# Patient Record
Sex: Female | Born: 1975
Health system: Southern US, Community
[De-identification: ages and names within clinical notes are randomized; demographics above are authoritative.]

## PROBLEM LIST (undated history)

## (undated) DIAGNOSIS — E0500 Thyrotoxicosis with diffuse goiter without thyrotoxic crisis or storm: Secondary | ICD-10-CM

## (undated) DIAGNOSIS — I8390 Asymptomatic varicose veins of unspecified lower extremity: Secondary | ICD-10-CM

## (undated) DIAGNOSIS — G43909 Migraine, unspecified, not intractable, without status migrainosus: Secondary | ICD-10-CM

## (undated) DIAGNOSIS — E039 Hypothyroidism, unspecified: Secondary | ICD-10-CM

## (undated) DIAGNOSIS — K219 Gastro-esophageal reflux disease without esophagitis: Secondary | ICD-10-CM

## (undated) HISTORY — DX: Thyrotoxicosis with diffuse goiter without thyrotoxic crisis or storm: E05.00

## (undated) HISTORY — PX: CERVICAL BIOPSY  W/ LOOP ELECTRODE EXCISION: SUR135

## (undated) HISTORY — DX: Gastro-esophageal reflux disease without esophagitis: K21.9

## (undated) HISTORY — DX: Asymptomatic varicose veins of unspecified lower extremity: I83.90

## (undated) HISTORY — DX: Hypothyroidism, unspecified: E03.9

## (undated) HISTORY — DX: Migraine, unspecified, not intractable, without status migrainosus: G43.909

---

## 2008-08-15 ENCOUNTER — Emergency Department (HOSPITAL_COMMUNITY): Admission: EM | Admit: 2008-08-15 | Discharge: 2008-08-15 | Payer: Self-pay | Admitting: Emergency Medicine

## 2014-05-07 ENCOUNTER — Ambulatory Visit
Admission: RE | Admit: 2014-05-07 | Discharge: 2014-05-07 | Disposition: A | Discharge status: Home / Self Care | Payer: BC Managed Care – PPO | Source: Ambulatory Visit | Attending: Internal Medicine | Admitting: Internal Medicine

## 2014-05-07 ENCOUNTER — Other Ambulatory Visit: Payer: Self-pay | Admitting: Internal Medicine

## 2014-05-07 DIAGNOSIS — M542 Cervicalgia: Secondary | ICD-10-CM

## 2014-05-07 DIAGNOSIS — M546 Pain in thoracic spine: Secondary | ICD-10-CM

## 2015-02-02 ENCOUNTER — Ambulatory Visit (HOSPITAL_COMMUNITY)
Admission: RE | Admit: 2015-02-02 | Discharge: 2015-02-02 | Disposition: A | Discharge status: Home / Self Care | Payer: BLUE CROSS/BLUE SHIELD | Source: Ambulatory Visit | Attending: Rheumatology | Admitting: Rheumatology

## 2015-02-02 ENCOUNTER — Other Ambulatory Visit (HOSPITAL_COMMUNITY): Payer: Self-pay | Admitting: Rheumatology

## 2015-02-02 DIAGNOSIS — M069 Rheumatoid arthritis, unspecified: Secondary | ICD-10-CM | POA: Insufficient documentation

## 2015-02-02 DIAGNOSIS — T451X5A Adverse effect of antineoplastic and immunosuppressive drugs, initial encounter: Secondary | ICD-10-CM

## 2016-08-16 DIAGNOSIS — K219 Gastro-esophageal reflux disease without esophagitis: Secondary | ICD-10-CM | POA: Diagnosis not present

## 2016-08-16 DIAGNOSIS — I8390 Asymptomatic varicose veins of unspecified lower extremity: Secondary | ICD-10-CM | POA: Diagnosis not present

## 2016-08-16 DIAGNOSIS — E039 Hypothyroidism, unspecified: Secondary | ICD-10-CM | POA: Diagnosis not present

## 2016-08-24 ENCOUNTER — Encounter: Payer: Self-pay | Admitting: Vascular Surgery

## 2016-09-01 ENCOUNTER — Encounter: Payer: Self-pay | Admitting: Vascular Surgery

## 2016-09-13 ENCOUNTER — Encounter: Payer: Self-pay | Admitting: Vascular Surgery

## 2016-09-13 ENCOUNTER — Ambulatory Visit (INDEPENDENT_AMBULATORY_CARE_PROVIDER_SITE_OTHER): Payer: 59 | Admitting: Vascular Surgery

## 2016-09-13 VITALS — BP 131/86 | HR 92 | Temp 98.4°F | Resp 16 | Ht 67.0 in | Wt 211.0 lb

## 2016-09-13 DIAGNOSIS — I83891 Varicose veins of right lower extremities with other complications: Secondary | ICD-10-CM | POA: Diagnosis not present

## 2016-09-13 NOTE — Progress Notes (Signed)
Subjective:     Patient ID: Kayla Aguilar, female   DOB: [birth year], 41 y.o.   MRN: [id number]  HPI This 41 year old female was referred by Dr.Varadarahan for evaluation of painful "knot" in the right leg area patient does develop swelling in both ankles as the day progresses. She sits during her job most of the day with her feet in a dependent position. She has no history of DVT thrombophlebitis stasis ulcers bleeding. The localized "knot" is in the pre-tibial region on the right and it causes itching and stinging usually at the end of the day. She has no symptoms in the left leg. She does not wear elastic compression stockings.  Past Medical History:  Diagnosis Date  . Esophageal reflux   . Graves disease   . Hypothyroid   . Migraine headache   . Varicose veins of lower extremity     Social History  Substance Use Topics  . Smoking status: Current Every Day Smoker    Packs/day: 0.50  . Smokeless tobacco: Never Used  . Alcohol use Yes     Comment: occaisionally a beer    No family history on file.  No Known Allergies   Current Outpatient Prescriptions:  .  levothyroxine (SYNTHROID, LEVOTHROID) 75 MCG tablet, Take by mouth., Disp: , Rfl:  .  metroNIDAZOLE (FLAGYL) 500 MG tablet, Take 4 tablets once by mouth, Disp: , Rfl:  .  norethindrone (CAMILA) 0.35 MG tablet, TAKE 1 TABLET BY MOUTH DAILY., Disp: , Rfl:  .  omeprazole (PRILOSEC) 20 MG capsule, Take 20 mg by mouth daily., Disp: , Rfl:   Vitals:   09/13/16 1002  BP: 131/86  Pulse: 92  Resp: 16  Temp: 98.4 F (36.9 C)  SpO2: 100%  Weight: 211 lb (95.7 kg)  Height: 5\' 7"  (1.702 m)    Body mass index is 33.05 kg/m.         Review of Systems Denies history of chest pain, dyspnea on exertion, PND, orthopnea, hemoptysis. Has history of Graves' disease.    Objective:   Physical Exam BP 131/86 (BP Location: Left Arm, Patient Position: Sitting, Cuff Size: Normal)   Pulse 92   Temp 98.4 F (36.9 C)   Resp  16   Ht 5\' 7"  (1.702 m)   Wt 211 lb (95.7 kg)   SpO2 100%   BMI 33.05 kg/m     Gen.-alert and oriented x3 in no apparent distress HEENT normal for age Lungs no rhonchi or wheezing Cardiovascular regular rhythm no murmurs carotid pulses 3+ palpable no bruits audible Abdomen soft nontender no palpable masses Musculoskeletal free of  major deformities Skin clear -no rashes Neurologic normal Lower extremities 3+ femoral and dorsalis pedis pulses palpable bilaterally with no edema Isolated prominent vein about 5 cm in length and 4 mm in diameter in the right pretibial region with slightly enlarged area laterally measuring up to 6 mm in diameter. No bulging varicosities noted in the leg. No hyperpigmentation or ulceration noted.  Today I performed a bedside SonoSite ultrasound exam which revealed normal great saphenous veins bilaterally with no evidence of reflux or enlargement       Assessment:     #1 isolated pretibial vein right leg with small varix and no evidence of reflux in great saphenous system bilaterally on ultrasound    Plan:     Discussed  the option of foam sclerotherapy versus no treatment and also explained to patient that there is nothing dangerous about this prominent vein She has  opted for no treatment if this enlarges or becomes more symptomatic she will be back in touch with [place] for further evaluation Return on when necessary basis

## 2017-04-12 DIAGNOSIS — Z124 Encounter for screening for malignant neoplasm of cervix: Secondary | ICD-10-CM | POA: Diagnosis not present

## 2017-04-12 DIAGNOSIS — R350 Frequency of micturition: Secondary | ICD-10-CM | POA: Diagnosis not present

## 2017-04-12 DIAGNOSIS — Z01419 Encounter for gynecological examination (general) (routine) without abnormal findings: Secondary | ICD-10-CM | POA: Diagnosis not present

## 2017-05-09 DIAGNOSIS — Z1231 Encounter for screening mammogram for malignant neoplasm of breast: Secondary | ICD-10-CM | POA: Diagnosis not present

## 2017-05-12 ENCOUNTER — Other Ambulatory Visit: Payer: Self-pay | Admitting: Obstetrics and Gynecology

## 2017-05-12 DIAGNOSIS — R928 Other abnormal and inconclusive findings on diagnostic imaging of breast: Secondary | ICD-10-CM

## 2017-05-24 ENCOUNTER — Other Ambulatory Visit: Payer: 59

## 2017-05-30 ENCOUNTER — Other Ambulatory Visit: Payer: Self-pay | Admitting: Obstetrics and Gynecology

## 2017-05-30 ENCOUNTER — Ambulatory Visit
Admission: RE | Admit: 2017-05-30 | Discharge: 2017-05-30 | Disposition: A | Discharge status: Home / Self Care | Payer: 59 | Source: Ambulatory Visit | Attending: Obstetrics and Gynecology | Admitting: Obstetrics and Gynecology

## 2017-05-30 DIAGNOSIS — R928 Other abnormal and inconclusive findings on diagnostic imaging of breast: Secondary | ICD-10-CM

## 2017-05-30 DIAGNOSIS — N6310 Unspecified lump in the right breast, unspecified quadrant: Secondary | ICD-10-CM

## 2017-05-30 DIAGNOSIS — N6321 Unspecified lump in the left breast, upper outer quadrant: Secondary | ICD-10-CM | POA: Diagnosis not present

## 2017-06-08 DIAGNOSIS — K219 Gastro-esophageal reflux disease without esophagitis: Secondary | ICD-10-CM | POA: Diagnosis not present

## 2017-06-08 DIAGNOSIS — Z23 Encounter for immunization: Secondary | ICD-10-CM | POA: Diagnosis not present

## 2017-06-08 DIAGNOSIS — E039 Hypothyroidism, unspecified: Secondary | ICD-10-CM | POA: Diagnosis not present

## 2017-06-08 DIAGNOSIS — Z0000 Encounter for general adult medical examination without abnormal findings: Secondary | ICD-10-CM | POA: Diagnosis not present

## 2017-08-15 DIAGNOSIS — M2550 Pain in unspecified joint: Secondary | ICD-10-CM | POA: Diagnosis not present

## 2017-08-15 DIAGNOSIS — M542 Cervicalgia: Secondary | ICD-10-CM | POA: Diagnosis not present

## 2017-09-13 DIAGNOSIS — M542 Cervicalgia: Secondary | ICD-10-CM | POA: Diagnosis not present

## 2017-09-13 DIAGNOSIS — M2550 Pain in unspecified joint: Secondary | ICD-10-CM | POA: Diagnosis not present

## 2017-10-23 ENCOUNTER — Ambulatory Visit: Payer: Self-pay | Admitting: Podiatry

## 2017-11-28 ENCOUNTER — Other Ambulatory Visit: Payer: 59

## 2017-12-12 ENCOUNTER — Ambulatory Visit
Admission: RE | Admit: 2017-12-12 | Discharge: 2017-12-12 | Disposition: A | Discharge status: Home / Self Care | Payer: 59 | Source: Ambulatory Visit | Attending: Obstetrics and Gynecology | Admitting: Obstetrics and Gynecology

## 2017-12-12 ENCOUNTER — Other Ambulatory Visit: Payer: Self-pay | Admitting: Obstetrics and Gynecology

## 2017-12-12 DIAGNOSIS — N63 Unspecified lump in unspecified breast: Secondary | ICD-10-CM

## 2017-12-12 DIAGNOSIS — R928 Other abnormal and inconclusive findings on diagnostic imaging of breast: Secondary | ICD-10-CM | POA: Diagnosis not present

## 2017-12-12 DIAGNOSIS — N6310 Unspecified lump in the right breast, unspecified quadrant: Secondary | ICD-10-CM

## 2018-01-01 ENCOUNTER — Ambulatory Visit: Payer: 59 | Admitting: Podiatry

## 2018-01-01 ENCOUNTER — Ambulatory Visit (INDEPENDENT_AMBULATORY_CARE_PROVIDER_SITE_OTHER): Payer: 59

## 2018-01-01 DIAGNOSIS — M7752 Other enthesopathy of left foot: Secondary | ICD-10-CM

## 2018-01-01 DIAGNOSIS — F17200 Nicotine dependence, unspecified, uncomplicated: Secondary | ICD-10-CM | POA: Insufficient documentation

## 2018-01-01 DIAGNOSIS — M779 Enthesopathy, unspecified: Secondary | ICD-10-CM

## 2018-01-01 DIAGNOSIS — L989 Disorder of the skin and subcutaneous tissue, unspecified: Secondary | ICD-10-CM

## 2018-01-01 DIAGNOSIS — M722 Plantar fascial fibromatosis: Secondary | ICD-10-CM

## 2018-01-01 DIAGNOSIS — M778 Other enthesopathies, not elsewhere classified: Secondary | ICD-10-CM

## 2018-01-01 MED ORDER — METHYLPREDNISOLONE 4 MG PO TBPK: ORAL_TABLET | ORAL | 0 refills | Status: DC

## 2018-01-01 MED ORDER — MELOXICAM 15 MG PO TABS: 15.0000 mg | ORAL_TABLET | Freq: Every day | ORAL | 1 refills | Status: DC

## 2018-01-01 NOTE — Patient Instructions (Signed)
For instructions on how to put on your Plantar Fascial Brace, please visit [email or web address]  

## 2018-01-03 NOTE — Progress Notes (Signed)
   Subjective: 42 year old female presenting today as a new patient with a chief complaint of a pain to the plantar aspect of the left heel that began several months ago. She reports an associate nodule to the left fifth toe. Walking increases the pain. She has not done anything for treatment. Patient is here for further evaluation and treatment.   Past Medical History:  Diagnosis Date  . Esophageal reflux   . Graves disease   . Hypothyroid   . Migraine headache   . Varicose veins of lower extremity      Objective: Physical Exam General: The patient is alert and oriented x3 in no acute distress.  Dermatology: Hyperkeratotic lesion present on the left foot x 2. Pain on palpation with a central nucleated core noted. Skin is warm, dry and supple bilateral lower extremities. Negative for open lesions or macerations bilateral.   Vascular: Dorsalis Pedis and Posterior Tibial pulses palpable bilateral.  Capillary fill time is immediate to all digits.  Neurological: Epicritic and protective threshold intact bilateral.   Musculoskeletal: Tenderness to palpation to the plantar aspect of the left heel along the plantar fascia. Pain with palpation to the fourth MPJ of the left foot. All other joints range of motion within normal limits bilateral. Strength 5/5 in all groups bilateral.   Radiographic exam:   Normal osseous mineralization. Joint spaces preserved. No fracture/dislocation/boney destruction. No other soft tissue abnormalities or radiopaque foreign bodies.   Assessment: 1. Plantar fasciitis left foot 2. 4th MPJ capsulitis left 3. Porokeratosis left x 2  Plan of Care:  1. Patient evaluated. Xrays reviewed.   2. Injection of 0.5cc Celestone soluspan injected into the left plantar fascia.  3. Injection of 0.5 mLs Celestone Soluspan injected into the 4th MPJ of the left foot.  4. Rx for Medrol Dose Pak placed 5. Rx for Meloxicam ordered for patient. 6. Plantar fascial band(s)  dispensed  7. Instructed patient regarding therapies and modalities at home to alleviate symptoms.  8. Excisional debridement of keratoic lesions using a chisel blade was performed without incident.  9. Return to clinic in 4 weeks.     Felecia Shelling, DPM [facility]  Dr. Felecia Shelling, DPM    [address] [place], [place] [address]                Office [phone]  Fax [phone]

## 2018-01-09 ENCOUNTER — Telehealth: Payer: Self-pay | Admitting: Podiatry

## 2018-01-09 MED ORDER — MELOXICAM 15 MG PO TABS: 15.0000 mg | ORAL_TABLET | Freq: Every day | ORAL | 1 refills | Status: AC

## 2018-01-09 NOTE — Telephone Encounter (Signed)
I saw Dr. Logan Bores on 12 August and he prescribed me meloxicam. I went out of town this weekend and left the medication there. It's going to be 2 weeks before I go back there and can get my medication. I wanted to see if there is a partial/temporary prescription I can get called into my pharmacy. If you could call me back and let me know what the best thing I can do is. You can reach me at work [phone] or my cell phone [phone]. Thank you.

## 2018-01-09 NOTE — Telephone Encounter (Addendum)
I informed pt Dr. Logan Bores had okayed refill as previously.

## 2018-01-09 NOTE — Addendum Note (Signed)
Addended by: Alphia Kava D on: 01/09/2018 10:05 AM   Modules accepted: Orders

## 2018-01-31 ENCOUNTER — Ambulatory Visit: Payer: 59 | Admitting: Podiatry

## 2018-02-28 ENCOUNTER — Ambulatory Visit: Payer: 59 | Admitting: Podiatry

## 2018-02-28 ENCOUNTER — Encounter

## 2018-02-28 DIAGNOSIS — M7752 Other enthesopathy of left foot: Secondary | ICD-10-CM

## 2018-02-28 DIAGNOSIS — M722 Plantar fascial fibromatosis: Secondary | ICD-10-CM | POA: Diagnosis not present

## 2018-03-02 ENCOUNTER — Ambulatory Visit: Payer: 59 | Admitting: Podiatry

## 2018-03-03 NOTE — Progress Notes (Signed)
   Subjective: 42 year old female presenting today for follow up evaluation of left foot pain. She states the pain resolved for about two months after receiving the injection at the previous visit. She now notes pain in the arch and under the fourth toe. She has been taking Meloxicam for pain. There are no modifying factors noted. Patient is here for further evaluation and treatment.   Past Medical History:  Diagnosis Date  . Esophageal reflux   . Graves disease   . Hypothyroid   . Migraine headache   . Varicose veins of lower extremity      Objective: Physical Exam General: The patient is alert and oriented x3 in no acute distress.  Dermatology: Skin is warm, dry and supple bilateral lower extremities. Negative for open lesions or macerations bilateral.   Vascular: Dorsalis Pedis and Posterior Tibial pulses palpable bilateral.  Capillary fill time is immediate to all digits.  Neurological: Epicritic and protective threshold intact bilateral.   Musculoskeletal: Tenderness to palpation to the plantar aspect of the left heel along the plantar fascia. Pain with palpation to the fourth MPJ of the left foot. All other joints range of motion within normal limits bilateral. Strength 5/5 in all groups bilateral.   Assessment: 1. Plantar fasciitis left foot 2. 4th MPJ capsulitis left  Plan of Care:  1. Patient evaluated.    2. Injection of 0.5cc Celestone soluspan injected into the left plantar fascia.  3. Injection of 0.5 mLs Celestone Soluspan injected into the 4th MPJ of the left foot.  4. Continue taking Meloxicam daily.  5. Continue using plantar fascial brace.  6. Return to clinic as needed.   Son getting married in [place] this weekend.    Felecia Shelling, DPM [facility]  Dr. Felecia Shelling, DPM    [address] [place], [place] [address]                Office [phone]  Fax [phone]

## 2018-04-04 ENCOUNTER — Ambulatory Visit: Payer: 59 | Admitting: Podiatry

## 2018-04-04 ENCOUNTER — Encounter: Payer: Self-pay | Admitting: Podiatry

## 2018-04-04 DIAGNOSIS — M722 Plantar fascial fibromatosis: Secondary | ICD-10-CM

## 2018-04-04 MED ORDER — METHYLPREDNISOLONE 4 MG PO TBPK: ORAL_TABLET | ORAL | 0 refills | Status: DC

## 2018-04-04 MED ORDER — MELOXICAM 15 MG PO TABS: 15.0000 mg | ORAL_TABLET | Freq: Every day | ORAL | 1 refills | Status: AC

## 2018-04-08 NOTE — Progress Notes (Signed)
   Subjective: 42 year old female presenting today for follow up evaluation of left foot pain. She states her symptoms have improved. She reports only having pain once she has been on the foot for long periods of time. She has been taking the Meloxicam as directed. Patient is here for further evaluation and treatment.   Past Medical History:  Diagnosis Date  . Esophageal reflux   . Graves disease   . Hypothyroid   . Migraine headache   . Varicose veins of lower extremity      Objective: Physical Exam General: The patient is alert and oriented x3 in no acute distress.  Dermatology: Skin is warm, dry and supple bilateral lower extremities. Negative for open lesions or macerations bilateral.   Vascular: Dorsalis Pedis and Posterior Tibial pulses palpable bilateral.  Capillary fill time is immediate to all digits.  Neurological: Epicritic and protective threshold intact bilateral.   Musculoskeletal: Tenderness to palpation to the plantar aspect of the left heel along the plantar fascia. All other joints range of motion within normal limits bilateral. Strength 5/5 in all groups bilateral.   Assessment: 1. Plantar fasciitis left foot 2. 4th MPJ capsulitis left - resolved   Plan of Care:  1. Patient evaluated.    2. Injection of 0.5cc Celestone soluspan injected into the left plantar fascia.  3. Prescription for Medrol Dose Pak provided to patient. Then continue taking Meloxicam daily.  4. Return to clinic as needed.    Felecia Shelling, DPM [facility]  Dr. Felecia Shelling, DPM    [address] [place], [place] [address]                Office [phone]  Fax [phone]

## 2018-04-30 DIAGNOSIS — R1310 Dysphagia, unspecified: Secondary | ICD-10-CM | POA: Diagnosis not present

## 2018-05-08 DIAGNOSIS — K228 Other specified diseases of esophagus: Secondary | ICD-10-CM | POA: Diagnosis not present

## 2018-05-08 DIAGNOSIS — R12 Heartburn: Secondary | ICD-10-CM | POA: Diagnosis not present

## 2018-05-08 DIAGNOSIS — R1310 Dysphagia, unspecified: Secondary | ICD-10-CM | POA: Diagnosis not present

## 2018-05-08 DIAGNOSIS — K219 Gastro-esophageal reflux disease without esophagitis: Secondary | ICD-10-CM | POA: Diagnosis not present

## 2018-06-06 ENCOUNTER — Ambulatory Visit: Payer: 59 | Admitting: Podiatry

## 2018-06-06 ENCOUNTER — Encounter: Payer: Self-pay | Admitting: Podiatry

## 2018-06-06 DIAGNOSIS — M722 Plantar fascial fibromatosis: Secondary | ICD-10-CM

## 2018-06-06 DIAGNOSIS — M7752 Other enthesopathy of left foot: Secondary | ICD-10-CM

## 2018-06-06 MED ORDER — METHYLPREDNISOLONE 4 MG PO TBPK: ORAL_TABLET | ORAL | 0 refills | Status: DC

## 2018-06-07 ENCOUNTER — Telehealth: Payer: Self-pay | Admitting: *Deleted

## 2018-06-07 NOTE — Telephone Encounter (Signed)
"  I saw Dr. Amalia Hailey yesterday.  I'm supposed to schedule surgery.  I wasn't given any information about the surgery."  I have your information.  Did you sign consent forms?  "Yes, I did.  I didn't get a boot or anything.  Do I need one?"  The nurse gave me your information and asked me to call you.  She said you had left before she could see you.  I can help you.  Yes, you do need a boot.  Did you get a surgical kit?  "No, I didn't get anything."  Can you come by the office to pick up the boot and the kit?  "Yes, I'll swing by there today."  Do you have a date in mind for your surgery?  "I'd like to do it on March 5."  I'll get it scheduled.  You cannot eat or drink anything after midnight the night before your surgery date.  You will need someone to go with you.  Someone from the surgical center will call you a day or two prior to your surgery date and they will give you your arrival time.  You need to go online and register with the surgical center, the instructions are in the brochure that we are giving you in your surgery kit.

## 2018-06-07 NOTE — Patient Instructions (Signed)
Pre-Operative Instructions  Congratulations, you have decided to take an important step towards improving your quality of life.  You can be assured that the doctors and staff at [facility] will be with you every step of the way.  Here are some important things you should know:  1. Plan to be at the surgery center/hospital at least 1 (one) hour prior to your scheduled time, unless otherwise directed by the surgical center/hospital staff.  You must have a responsible adult accompany you, remain during the surgery and drive you home.  Make sure you have directions to the surgical center/hospital to ensure you arrive on time. 2. If you are having surgery at [facility] or [facility] hospitals, you will need a copy of your medical history and physical form from your family physician within one month prior to the date of surgery. We will give you a form for your primary physician to complete.  3. We make every effort to accommodate the date you request for surgery.  However, there are times where surgery dates or times have to be moved.  We will contact you as soon as possible if a change in schedule is required.   4. No aspirin/ibuprofen for one week before surgery.  If you are on aspirin, any non-steroidal anti-inflammatory medications (Mobic, Aleve, Ibuprofen) should not be taken seven (7) days prior to your surgery.  You make take Tylenol for pain prior to surgery.  5. Medications - If you are taking daily heart and blood pressure medications, seizure, reflux, allergy, asthma, anxiety, pain or diabetes medications, make sure you notify the surgery center/hospital before the day of surgery so they can tell you which medications you should take or avoid the day of surgery. 6. No food or drink after midnight the night before surgery unless directed otherwise by surgical center/hospital staff. 7. No alcoholic beverages 24-hours prior to surgery.  No smoking 24-hours prior or 24-hours after  surgery. 8. Wear loose pants or shorts. They should be loose enough to fit over bandages, boots, and casts. 9. Don't wear slip-on shoes. Sneakers are preferred. 10. Bring your boot with you to the surgery center/hospital.  Also bring crutches or a walker if your physician has prescribed it for you.  If you do not have this equipment, it will be provided for you after surgery. 11. If you have not been contacted by the surgery center/hospital by the day before your surgery, call to confirm the date and time of your surgery. 12. Leave-time from work may vary depending on the type of surgery you have.  Appropriate arrangements should be made prior to surgery with your employer. 13. Prescriptions will be provided immediately following surgery by your doctor.  Fill these as soon as possible after surgery and take the medication as directed. Pain medications will not be refilled on weekends and must be approved by the doctor. 14. Remove nail polish on the operative foot and avoid getting pedicures prior to surgery. 15. Wash the night before surgery.  The night before surgery wash the foot and leg well with water and the antibacterial soap provided. Be sure to pay special attention to beneath the toenails and in between the toes.  Wash for at least three (3) minutes. Rinse thoroughly with water and dry well with a towel.  Perform this wash unless told not to do so by your physician.  Enclosed: 1 Ice pack (please put in freezer the night before surgery)   1 Hibiclens skin cleaner     Pre-op instructions  If you have any questions regarding the instructions, please do not hesitate to call our office.  [place]: [address], [place], [place] [address] -- [phone]  [place]: [address] [place], [place] [address] -- [phone]  [place]: [address]  [place], [place] [address] -- [phone]  [place]: [address], [address], [place], [place] [address] -- [phone]  Website:  [email or web address] 

## 2018-06-10 NOTE — Progress Notes (Signed)
   Subjective: 43 year old female presenting today for follow up evaluation of left foot pain. She states the pain had improved but is now starting to flare up again. She states the injections as well as taking Meloxicam are the only thing that help alleviate her pain. Walking and being on her feet increase the symptoms. Patient is here for further evaluation and treatment.   Past Medical History:  Diagnosis Date  . Esophageal reflux   . Graves disease   . Hypothyroid   . Migraine headache   . Varicose veins of lower extremity      Objective: Physical Exam General: The patient is alert and oriented x3 in no acute distress.  Dermatology: Skin is warm, dry and supple bilateral lower extremities. Negative for open lesions or macerations bilateral.   Vascular: Dorsalis Pedis and Posterior Tibial pulses palpable bilateral.  Capillary fill time is immediate to all digits.  Neurological: Epicritic and protective threshold intact bilateral.   Musculoskeletal: Tenderness to palpation to the plantar aspect of the left heel along the plantar fascia as well as the 4th MPJ of the left foot. All other joints range of motion within normal limits bilateral. Strength 5/5 in all groups bilateral.   Assessment: 1. Chronic plantar fasciitis left foot 2. 4th MPJ capsulitis left   Plan of Care:  1. Patient evaluated.    2. Injection of 0.5cc Celestone soluspan injected into the left plantar fascia.  3. Prescription for Medrol Dose Pak provided to patient.  4. Discussed physical therapy vs EPAT vs EPF surgery. Patient will go home and consider her options.  5. All possible complications and details of the procedure were explained. All patient questions were answered. No guarantees were expressed or implied. 6. Authorization for surgery was initiated today. Surgery will consist of EPF left. She will call to schedule surgery if she wants to proceed.  7. Return to clinic as needed.    Felecia Shelling,  DPM [facility]  Dr. Felecia Shelling, DPM    [address] [place], [place] [address]                Office [phone]  Fax [phone]

## 2018-06-20 ENCOUNTER — Ambulatory Visit
Admission: RE | Admit: 2018-06-20 | Discharge: 2018-06-20 | Disposition: A | Discharge status: Home / Self Care | Payer: 59 | Source: Ambulatory Visit | Attending: Obstetrics and Gynecology | Admitting: Obstetrics and Gynecology

## 2018-06-20 DIAGNOSIS — N63 Unspecified lump in unspecified breast: Secondary | ICD-10-CM

## 2018-06-20 DIAGNOSIS — N6310 Unspecified lump in the right breast, unspecified quadrant: Secondary | ICD-10-CM | POA: Diagnosis not present

## 2018-06-20 DIAGNOSIS — R928 Other abnormal and inconclusive findings on diagnostic imaging of breast: Secondary | ICD-10-CM | POA: Diagnosis not present

## 2018-07-26 ENCOUNTER — Other Ambulatory Visit: Payer: Self-pay | Admitting: Podiatry

## 2018-07-26 DIAGNOSIS — M25572 Pain in left ankle and joints of left foot: Secondary | ICD-10-CM | POA: Diagnosis not present

## 2018-07-26 DIAGNOSIS — M722 Plantar fascial fibromatosis: Secondary | ICD-10-CM | POA: Diagnosis not present

## 2018-07-26 MED ORDER — OXYCODONE-ACETAMINOPHEN 5-325 MG PO TABS: 1.0000 | ORAL_TABLET | ORAL | 0 refills | Status: DC | PRN

## 2018-07-26 NOTE — Progress Notes (Signed)
.  postop

## 2018-07-30 ENCOUNTER — Telehealth: Payer: Self-pay

## 2018-07-30 ENCOUNTER — Encounter: Payer: Self-pay | Admitting: Podiatry

## 2018-08-01 ENCOUNTER — Ambulatory Visit (INDEPENDENT_AMBULATORY_CARE_PROVIDER_SITE_OTHER): Payer: Self-pay | Admitting: Podiatry

## 2018-08-01 ENCOUNTER — Other Ambulatory Visit: Payer: Self-pay

## 2018-08-01 VITALS — Temp 98.8°F

## 2018-08-01 DIAGNOSIS — M722 Plantar fascial fibromatosis: Secondary | ICD-10-CM

## 2018-08-01 NOTE — Telephone Encounter (Signed)
Was trying to call patient post-surgery; unsuccessful

## 2018-08-06 NOTE — Progress Notes (Signed)
   Subjective:  Patient presents today status post EPF left. DOS: 07/26/2018. She states she is doing well overall. She reports moderate soreness of the foot until today. She reports a significant decrease in the pain since getting up this morning. She has been using the CAM boot as directed and denies modifying factors. Patient is here for further evaluation and treatment.    Past Medical History:  Diagnosis Date  . Esophageal reflux   . Graves disease   . Hypothyroid   . Migraine headache   . Varicose veins of lower extremity       Objective/Physical Exam Neurovascular status intact.  Skin incisions appear to be well coapted with sutures and staples intact. No sign of infectious process noted. No dehiscence. No active bleeding noted. Moderate edema noted to the surgical extremity.  Assessment: 1. s/p EPF left. DOS: 07/26/2018   Plan of Care:  1. Patient was evaluated. 2. Dressing changed.  3. Continue weightbearing in CAM boot.  4. Night splint dispensed for sleeping.  5. Return to clinic in one week for suture removal.    Felecia Shelling, DPM [facility]  Dr. Felecia Shelling, DPM    [address]                                        [place], [place] [address]                Office [phone]  Fax [phone]

## 2018-08-08 ENCOUNTER — Other Ambulatory Visit: Payer: Self-pay

## 2018-08-08 ENCOUNTER — Encounter: Payer: Self-pay | Admitting: Podiatry

## 2018-08-08 ENCOUNTER — Ambulatory Visit (INDEPENDENT_AMBULATORY_CARE_PROVIDER_SITE_OTHER): Payer: 59 | Admitting: Podiatry

## 2018-08-08 DIAGNOSIS — M722 Plantar fascial fibromatosis: Secondary | ICD-10-CM | POA: Diagnosis not present

## 2018-08-10 ENCOUNTER — Telehealth: Payer: Self-pay | Admitting: Podiatry

## 2018-08-10 NOTE — Progress Notes (Signed)
Patient is here for postop visit, date of surgery 07/30/2018 endoscopic plantar fasciotomy left foot.  She states that overall her foot continues to feel better, and feels better than before the surgery, but she does have some soreness in the heel.  Noted well-healing surgical sites, all sutures were removed.  Advised patient to remain in her boot for the next 2 weeks, follow-up in 2 weeks with Dr. Logan Bores.

## 2018-08-10 NOTE — Telephone Encounter (Signed)
I'm returning to work on Monday from [facility] surgery. I need someone to fill out my STD paperwork for me to turn in. Please call me back.

## 2018-08-15 DIAGNOSIS — M79676 Pain in unspecified toe(s): Secondary | ICD-10-CM

## 2018-08-22 ENCOUNTER — Ambulatory Visit (INDEPENDENT_AMBULATORY_CARE_PROVIDER_SITE_OTHER): Payer: 59 | Admitting: Podiatry

## 2018-08-22 ENCOUNTER — Other Ambulatory Visit: Payer: Self-pay

## 2018-08-22 ENCOUNTER — Telehealth: Payer: Self-pay | Admitting: *Deleted

## 2018-08-22 ENCOUNTER — Encounter: Payer: Self-pay | Admitting: Podiatry

## 2018-08-22 VITALS — Temp 97.8°F

## 2018-08-22 DIAGNOSIS — Z9889 Other specified postprocedural states: Secondary | ICD-10-CM

## 2018-08-22 DIAGNOSIS — M722 Plantar fascial fibromatosis: Secondary | ICD-10-CM

## 2018-08-22 NOTE — Telephone Encounter (Signed)
Pt states she was looking over the short-term disability statement completed by Dr. Logan Bores and her surgery date should be 07/26/2018.

## 2018-08-22 NOTE — Progress Notes (Signed)
   Subjective:  Patient presents today status post EPF left. DOS: 07/26/2018. She states she is doing well overall.  Patient denies tenderness or pain.  She has been weightbearing in the cam boot as directed and wearing the night splint at night.  No new complaints at this time.    Past Medical History:  Diagnosis Date  . Esophageal reflux   . Graves disease   . Hypothyroid   . Migraine headache   . Varicose veins of lower extremity       Objective/Physical Exam Neurovascular status intact.  Skin incisions appear to be well coapted.  Negative for any tenderness to palpation along the plantar fascia.  Muscle strength 5/5 all compartments.  Assessment: 1. s/p EPF left. DOS: 07/26/2018   Plan of Care:  1. Patient was evaluated. 2.  Discontinue cam boot.  Patient may begin to transition into good supportive sneakers 3.  Slowly increase to full activity no restrictions over the next 3-4 weeks 4.  Continue wearing night splint at night 5.  Return to clinic as needed  Felecia Shelling, DPM [facility]  Dr. Felecia Shelling, DPM    [address]                                        [place], [place] [address]                Office [phone]  Fax [phone]

## 2018-09-03 ENCOUNTER — Encounter: Payer: 59 | Admitting: Podiatry

## 2019-07-19 ENCOUNTER — Other Ambulatory Visit: Payer: Self-pay

## 2019-07-19 ENCOUNTER — Emergency Department (HOSPITAL_COMMUNITY)
Admission: EM | Admit: 2019-07-19 | Discharge: 2019-07-19 | Disposition: A | Discharge status: Home / Self Care | Payer: 59 | Attending: Emergency Medicine | Admitting: Emergency Medicine

## 2019-07-19 ENCOUNTER — Encounter (HOSPITAL_COMMUNITY): Payer: Self-pay | Admitting: Emergency Medicine

## 2019-07-19 ENCOUNTER — Emergency Department (HOSPITAL_COMMUNITY): Payer: 59

## 2019-07-19 DIAGNOSIS — E039 Hypothyroidism, unspecified: Secondary | ICD-10-CM | POA: Insufficient documentation

## 2019-07-19 DIAGNOSIS — R1012 Left upper quadrant pain: Secondary | ICD-10-CM | POA: Diagnosis not present

## 2019-07-19 DIAGNOSIS — D72829 Elevated white blood cell count, unspecified: Secondary | ICD-10-CM | POA: Diagnosis not present

## 2019-07-19 DIAGNOSIS — N12 Tubulo-interstitial nephritis, not specified as acute or chronic: Secondary | ICD-10-CM

## 2019-07-19 DIAGNOSIS — F17210 Nicotine dependence, cigarettes, uncomplicated: Secondary | ICD-10-CM | POA: Insufficient documentation

## 2019-07-19 DIAGNOSIS — Z20822 Contact with and (suspected) exposure to covid-19: Secondary | ICD-10-CM | POA: Diagnosis not present

## 2019-07-19 DIAGNOSIS — Z79899 Other long term (current) drug therapy: Secondary | ICD-10-CM | POA: Insufficient documentation

## 2019-07-19 LAB — COMPREHENSIVE METABOLIC PANEL
ALT: 11 U/L (ref 0–44)
AST: 14 U/L — ABNORMAL LOW (ref 15–41)
Albumin: 3.2 g/dL — ABNORMAL LOW (ref 3.5–5.0)
Alkaline Phosphatase: 83 U/L (ref 38–126)
Anion gap: 13 (ref 5–15)
BUN: 5 mg/dL — ABNORMAL LOW (ref 6–20)
CO2: 23 mmol/L (ref 22–32)
Calcium: 8.7 mg/dL — ABNORMAL LOW (ref 8.9–10.3)
Chloride: 97 mmol/L — ABNORMAL LOW (ref 98–111)
Creatinine, Ser: 1.27 mg/dL — ABNORMAL HIGH (ref 0.44–1.00)
GFR calc Af Amer: 60 mL/min — ABNORMAL LOW (ref >60)
GFR calc non Af Amer: 52 mL/min — ABNORMAL LOW (ref >60)
Glucose, Bld: 181 mg/dL — ABNORMAL HIGH (ref 70–99)
Potassium: 2.8 mmol/L — ABNORMAL LOW (ref 3.5–5.1)
Sodium: 133 mmol/L — ABNORMAL LOW (ref 135–145)
Total Bilirubin: 0.7 mg/dL (ref 0.3–1.2)
Total Protein: 6.6 g/dL (ref 6.5–8.1)

## 2019-07-19 LAB — CBC
HCT: 37.8 % (ref 36.0–46.0)
Hemoglobin: 12.9 g/dL (ref 12.0–15.0)
MCH: 30.2 pg (ref 26.0–34.0)
MCHC: 34.1 g/dL (ref 30.0–36.0)
MCV: 88.5 fL (ref 80.0–100.0)
Platelets: 465 10*3/uL — ABNORMAL HIGH (ref 150–400)
RBC: 4.27 MIL/uL (ref 3.87–5.11)
RDW: 11.6 % (ref 11.5–15.5)
WBC: 19 10*3/uL — ABNORMAL HIGH (ref 4.0–10.5)
nRBC: 0 % (ref 0.0–0.2)

## 2019-07-19 LAB — URINALYSIS, COMPLETE (UACMP) WITH MICROSCOPIC
Bacteria, UA: NONE SEEN
Bilirubin Urine: NEGATIVE
Glucose, UA: NEGATIVE mg/dL
Ketones, ur: NEGATIVE mg/dL
Nitrite: NEGATIVE
Protein, ur: NEGATIVE mg/dL
Specific Gravity, Urine: >1.046 — ABNORMAL HIGH (ref 1.005–1.030)
pH: 6 (ref 5.0–8.0)

## 2019-07-19 LAB — LACTIC ACID, PLASMA
Lactic Acid, Venous: 1.4 mmol/L (ref 0.5–1.9)
Lactic Acid, Venous: 1 mmol/L (ref 0.5–1.9)

## 2019-07-19 LAB — URINALYSIS, ROUTINE W REFLEX MICROSCOPIC
Bilirubin Urine: NEGATIVE
Glucose, UA: NEGATIVE mg/dL
Ketones, ur: NEGATIVE mg/dL
Nitrite: NEGATIVE
Protein, ur: 100 mg/dL — AB
Specific Gravity, Urine: 1.023 (ref 1.005–1.030)
WBC, UA: >50 WBC/hpf — ABNORMAL HIGH (ref 0–5)
pH: 5 (ref 5.0–8.0)

## 2019-07-19 LAB — MAGNESIUM: Magnesium: 1.6 mg/dL — ABNORMAL LOW (ref 1.7–2.4)

## 2019-07-19 LAB — TROPONIN I (HIGH SENSITIVITY)
Troponin I (High Sensitivity): 4 ng/L (ref <18)
Troponin I (High Sensitivity): 5 ng/L (ref <18)

## 2019-07-19 LAB — I-STAT BETA HCG BLOOD, ED (MC, WL, AP ONLY): I-stat hCG, quantitative: <5 m[IU]/mL (ref <5)

## 2019-07-19 LAB — SARS CORONAVIRUS 2 (TAT 6-24 HRS): SARS Coronavirus 2: NEGATIVE

## 2019-07-19 LAB — LIPASE, BLOOD: Lipase: 19 U/L (ref 11–51)

## 2019-07-19 MED ORDER — NICOTINE 21 MG/24HR TD PT24: 21.0000 mg | MEDICATED_PATCH | Freq: Once | TRANSDERMAL | Status: DC

## 2019-07-19 MED ORDER — FENTANYL CITRATE (PF) 100 MCG/2ML IJ SOLN
50.0000 ug | Freq: Once | INTRAMUSCULAR | Status: AC
  Administered 2019-07-19: 16:00:00 50 ug via INTRAVENOUS
  Filled 2019-07-19: qty 2

## 2019-07-19 MED ORDER — LACTATED RINGERS IV BOLUS (SEPSIS)
1000.0000 mL | Freq: Once | INTRAVENOUS | Status: AC
  Administered 2019-07-19: 16:00:00 1000 mL via INTRAVENOUS

## 2019-07-19 MED ORDER — SODIUM CHLORIDE 0.9 % IV SOLN
1.0000 g | Freq: Once | INTRAVENOUS | Status: AC
  Administered 2019-07-19: 1 g via INTRAVENOUS
  Filled 2019-07-19: qty 10

## 2019-07-19 MED ORDER — POTASSIUM CHLORIDE CRYS ER 20 MEQ PO TBCR
40.0000 meq | EXTENDED_RELEASE_TABLET | Freq: Once | ORAL | Status: AC
  Administered 2019-07-19: 40 meq via ORAL
  Filled 2019-07-19: qty 2

## 2019-07-19 MED ORDER — ALUM & MAG HYDROXIDE-SIMETH 200-200-20 MG/5ML PO SUSP
30.0000 mL | Freq: Once | ORAL | Status: AC
  Administered 2019-07-19: 16:00:00 30 mL via ORAL
  Filled 2019-07-19: qty 30

## 2019-07-19 MED ORDER — OXYCODONE-ACETAMINOPHEN 5-325 MG PO TABS: 1.0000 | ORAL_TABLET | Freq: Four times a day (QID) | ORAL | 0 refills | Status: AC | PRN

## 2019-07-19 MED ORDER — SODIUM CHLORIDE 0.9 % IV BOLUS
1000.0000 mL | Freq: Once | INTRAVENOUS | Status: AC
  Administered 2019-07-19: 13:00:00 1000 mL via INTRAVENOUS

## 2019-07-19 MED ORDER — CEPHALEXIN 500 MG PO CAPS: 500.0000 mg | ORAL_CAPSULE | Freq: Four times a day (QID) | ORAL | 0 refills | Status: AC

## 2019-07-19 MED ORDER — ONDANSETRON HCL 4 MG/2ML IJ SOLN
4.0000 mg | Freq: Once | INTRAMUSCULAR | Status: AC
  Administered 2019-07-19: 4 mg via INTRAVENOUS
  Filled 2019-07-19: qty 2

## 2019-07-19 MED ORDER — FENTANYL CITRATE (PF) 100 MCG/2ML IJ SOLN
50.0000 ug | Freq: Once | INTRAMUSCULAR | Status: AC
  Administered 2019-07-19: 13:00:00 50 ug via INTRAVENOUS
  Filled 2019-07-19: qty 2

## 2019-07-19 MED ORDER — LIDOCAINE VISCOUS HCL 2 % MT SOLN
15.0000 mL | Freq: Once | OROMUCOSAL | Status: AC
  Administered 2019-07-19: 15 mL via ORAL
  Filled 2019-07-19: qty 15

## 2019-07-19 MED ORDER — OXYCODONE-ACETAMINOPHEN 5-325 MG PO TABS
1.0000 | ORAL_TABLET | Freq: Once | ORAL | Status: AC
  Administered 2019-07-19: 18:00:00 1 via ORAL
  Filled 2019-07-19: qty 1

## 2019-07-19 MED ORDER — SODIUM CHLORIDE 0.9% FLUSH
3.0000 mL | Freq: Once | INTRAVENOUS | Status: AC
  Administered 2019-07-19: 13:00:00 3 mL via INTRAVENOUS

## 2019-07-19 MED ORDER — ONDANSETRON 4 MG PO TBDP: 4.0000 mg | ORAL_TABLET | Freq: Three times a day (TID) | ORAL | 0 refills | Status: AC | PRN

## 2019-07-19 MED ORDER — PANTOPRAZOLE SODIUM 40 MG IV SOLR
40.0000 mg | Freq: Once | INTRAVENOUS | Status: AC
  Administered 2019-07-19: 40 mg via INTRAVENOUS
  Filled 2019-07-19: qty 40

## 2019-07-19 MED ORDER — IOHEXOL 350 MG/ML SOLN
100.0000 mL | Freq: Once | INTRAVENOUS | Status: AC | PRN
  Administered 2019-07-19: 100 mL via INTRAVENOUS

## 2019-07-19 NOTE — ED Triage Notes (Signed)
Severe left upper quad pain, started Tuesday night- seen at urgent care, had labs drawn, no results yet-- denies urinary symptoms,

## 2019-07-19 NOTE — ED Provider Notes (Signed)
[facility] EMERGENCY DEPARTMENT Provider Note   CSN: [id number] Arrival date & time: 07/19/19  1018     History Chief Complaint  Patient presents with  . Abdominal Pain    Kayla Aguilar is a 44 y.o. female with a past medical history of GERD, tobacco abuse, hypothyroid, who presents today for evaluation of left upper quadrant abdominal pain/left lower chest pain. She reports her pain started Tuesday night.  She was seen at urgent care, had labs drawn and no results yet.  At that time she was started on new meds but had not started them yet.  She reports that she takes Prilosec every day for GERD.  She reports that her pain is continuing to be severe.  She states that it is under her left breast near the bra line.  She reports poor appetite and decreased p.o. intake.  She reports nausea without vomiting.  She did take a laxative after her pain had started without significant relief or change in her symptoms.  She denies any recent sick contacts.  She denies any rashes, lesions, or wounds.  No dysuria, increased frequency or urgency.  She denies any known history of peptic ulcer disease. She reports that she does not regularly drink alcohol or take NSAIDs, however notes that since her pain started she has been taking NSAIDs without significant relief in her pain.  No recent trauma or fevers.  She reports that when she takes a deep breath and it makes her pain hurt more. She has shortness of breath that she attributes to the pain.    HPI     Past Medical History:  Diagnosis Date  . Esophageal reflux   . Graves disease   . Hypothyroid   . Migraine headache   . Varicose veins of lower extremity     Patient Active Problem List   Diagnosis Date Noted  . Smoker 01/01/2018  . Varicose veins of right lower extremity with complications 09/13/2016    Past Surgical History:  Procedure Laterality Date  . CERVICAL BIOPSY  W/ LOOP ELECTRODE EXCISION       OB  History   No obstetric history on file.     Family History  Problem Relation Age of Onset  . Breast cancer Neg Hx     Social History   Tobacco Use  . Smoking status: Current Every Day Smoker    Packs/day: 0.50  . Smokeless tobacco: Never Used  Substance Use Topics  . Alcohol use: Yes    Comment: occaisionally a beer  . Drug use: No    Home Medications Prior to Admission medications   Medication Sig Start Date End Date Taking? Authorizing Provider  cephALEXin (KEFLEX) 500 MG capsule Take 1 capsule (500 mg total) by mouth 4 (four) times daily for 10 days. 07/19/19 07/29/19  Cristina Gong, PA-C  levonorgestrel (MIRENA, 52 MG,) 20 MCG/24HR IUD Mirena 20 mcg/24 hours (5 yrs) 52 mg intrauterine device    [provider]  levothyroxine (SYNTHROID, LEVOTHROID) 75 MCG tablet Take by mouth. 08/13/13   [provider]  methylPREDNISolone (MEDROL DOSEPAK) 4 MG TBPK tablet 6 day dose pack - take as directed 06/06/18   Felecia Shelling, DPM  metroNIDAZOLE (FLAGYL) 500 MG tablet Take 4 tablets once by mouth 04/29/13   [provider]  norethindrone (CAMILA) 0.35 MG tablet TAKE 1 TABLET BY MOUTH DAILY. 08/26/13   [provider]  omeprazole (PRILOSEC) 20 MG capsule Take 20 mg by mouth  daily.    [provider]  ondansetron (ZOFRAN ODT) 4 MG disintegrating tablet Take 1 tablet (4 mg total) by mouth every 8 (eight) hours as needed for nausea or vomiting. 07/19/19   Cristina Gong, PA-C  oxyCODONE-acetaminophen (PERCOCET/ROXICET) 5-325 MG tablet Take 1 tablet by mouth every 6 (six) hours as needed for severe pain. 07/19/19   Cristina Gong, PA-C  sulfamethoxazole-trimethoprim (BACTRIM DS,SEPTRA DS) 800-160 MG tablet sulfamethoxazole 800 mg-trimethoprim 160 mg tablet  take 1 tablet by mouth every 12 hours for 3 days    [provider]  trimethoprim-polymyxin b (POLYTRIM) ophthalmic solution polymyxin B sulfate 10,000 unit-trimethoprim 1  mg/mL eye drops  INSTILL 1 DROP INTO THE LEFT EYE EVERY 4 HOURS    [provider]    Allergies    Patient has no known allergies.  Review of Systems   Review of Systems  Constitutional: Negative for chills, fatigue and fever.  HENT: Negative for congestion.   Eyes: Negative for visual disturbance.  Respiratory: Positive for shortness of breath. Negative for cough.   Cardiovascular: Positive for chest pain. Negative for palpitations and leg swelling.  Gastrointestinal: Positive for abdominal pain and nausea. Negative for constipation, diarrhea and vomiting.  Genitourinary: Negative for dysuria, frequency and urgency.  Musculoskeletal: Negative for back pain and neck pain.  Skin: Negative for color change, rash and wound.  Neurological: Negative for weakness and headaches.  Psychiatric/Behavioral: Negative for confusion.  All other systems reviewed and are negative.   Physical Exam Updated Vital Signs BP 114/78 (BP Location: Right Arm)   Pulse 89   Temp 98.5 F (36.9 C) (Oral)   Resp 18   Ht 5\' 7"  (1.702 m)   Wt 97.5 kg   SpO2 100%   BMI 33.67 kg/m   Physical Exam Vitals and nursing note reviewed.  Constitutional:      General: She is not in acute distress.    Appearance: She is well-developed. She is not diaphoretic.  HENT:     Head: Normocephalic and atraumatic.  Eyes:     General: No scleral icterus.       Right eye: No discharge.        Left eye: No discharge.     Conjunctiva/sclera: Conjunctivae normal.  Cardiovascular:     Rate and Rhythm: Regular rhythm. Tachycardia present.     Heart sounds: Normal heart sounds. No murmur.  Pulmonary:     Effort: Pulmonary effort is normal. No respiratory distress.     Breath sounds: Normal breath sounds. No stridor.  Abdominal:     General: Bowel sounds are normal. There is no distension.     Palpations: Abdomen is soft.     Tenderness: There is abdominal tenderness in the epigastric area and left upper  quadrant. There is no guarding or rebound.  Musculoskeletal:        General: No deformity.     Cervical back: Normal range of motion.  Skin:    General: Skin is warm and dry.  Neurological:     General: No focal deficit present.     Mental Status: She is alert.     Cranial Nerves: No cranial nerve deficit.     Motor: No abnormal muscle tone.  Psychiatric:        Mood and Affect: Mood normal.        Behavior: Behavior normal.     ED Results / Procedures / Treatments   Labs (all labs ordered are listed, but only  abnormal results are displayed) Labs Reviewed  COMPREHENSIVE METABOLIC PANEL - Abnormal; Notable for the following components:      Result Value   Sodium 133 (*)    Potassium 2.8 (*)    Chloride 97 (*)    Glucose, Bld 181 (*)    BUN 5 (*)    Creatinine, Ser 1.27 (*)    Calcium 8.7 (*)    Albumin 3.2 (*)    AST 14 (*)    GFR calc non Af Amer 52 (*)    GFR calc Af Amer 60 (*)    All other components within normal limits  CBC - Abnormal; Notable for the following components:   WBC 19.0 (*)    Platelets 465 (*)    All other components within normal limits  URINALYSIS, ROUTINE W REFLEX MICROSCOPIC - Abnormal; Notable for the following components:   Color, Urine AMBER (*)    APPearance CLOUDY (*)    Hgb urine dipstick LARGE (*)    Protein, ur 100 (*)    Leukocytes,Ua MODERATE (*)    WBC, UA >50 (*)    Bacteria, UA MANY (*)    All other components within normal limits  MAGNESIUM - Abnormal; Notable for the following components:   Magnesium 1.6 (*)    All other components within normal limits  URINALYSIS, COMPLETE (UACMP) WITH MICROSCOPIC - Abnormal; Notable for the following components:   Specific Gravity, Urine >1.046 (*)    Hgb urine dipstick MODERATE (*)    Leukocytes,Ua MODERATE (*)    All other components within normal limits  CULTURE, BLOOD (ROUTINE X 2)  CULTURE, BLOOD (ROUTINE X 2)  URINE CULTURE  SARS CORONAVIRUS 2 (TAT 6-24 HRS)  LIPASE, BLOOD    LACTIC ACID, PLASMA  LACTIC ACID, PLASMA  MAGNESIUM  I-STAT BETA HCG BLOOD, ED ([facility], [facility], [facility] ONLY)  TROPONIN I (HIGH SENSITIVITY)  TROPONIN I (HIGH SENSITIVITY)    EKG EKG Interpretation  Date/Time:  Friday July 19 2019 12:04:48 EST Ventricular Rate:  78 PR Interval:    QRS Duration: 109 QT Interval:  388 QTC Calculation: 442 R Axis:   72 Text Interpretation: Sinus rhythm Abnormal R-wave progression, early transition Borderline repolarization abnormality Nonspecific T wave abnormality No previous ECGs available Confirmed by Glynn Octave [phone]) on 07/19/2019 12:13:48 PM   Radiology DG Chest Port 1 View  Result Date: 07/19/2019 CLINICAL DATA:  Left upper quadrant pain, mild shortness of breath EXAM: PORTABLE CHEST 1 VIEW COMPARISON:  02/02/2015 FINDINGS: Heart size is stable and normal accounting for portable technique. Hilar structures are unremarkable. Lungs are clear. Visualized skeletal structures are unremarkable. IMPRESSION: Negative exam. Electronically Signed   By: Donzetta Kohut M.D.   On: 07/19/2019 12:16   CT Angio Chest/Abd/Pel for Dissection W and/or W/WO  Result Date: 07/19/2019 CLINICAL DATA:  Chest and abdominal pain. Differential blood pressure between upper extremities. EXAM: CT ANGIOGRAPHY CHEST, ABDOMEN AND PELVIS TECHNIQUE: Initially, axial CT images were obtained through the chest without intravenous contrast material administration. Multidetector CT imaging through the chest, abdomen and pelvis was performed using the standard protocol during bolus administration of intravenous contrast. Multiplanar reconstructed images and MIPs were obtained and reviewed to evaluate the vascular anatomy. CONTRAST:  OMNIPAQUE IOHEXOL 350 MG/ML SOLN COMPARISON:  Chest radiograph July 19, 2019 FINDINGS: CTA CHEST FINDINGS Cardiovascular: There is no demonstrable intramural hematoma in the thoracic aorta on noncontrast enhanced study. There is no pericardial effusion or  pericardial thickening. There is no demonstrable pulmonary embolus.  Mediastinum/Nodes: Visualized thyroid appears normal. There is no demonstrable thoracic adenopathy. No esophageal lesions are evident. Lungs/Pleura: There is bibasilar atelectasis posteriorly. Lungs otherwise are clear. There is no evident pleural effusion. Musculoskeletal: No evident fracture or dislocation. No blastic or lytic bone lesions. No evident chest wall lesions. Review of the MIP images confirms the above findings. CTA ABDOMEN AND PELVIS FINDINGS VASCULAR Aorta: There is no abdominal aortic aneurysm or dissection. There is slight peripheral arterial calcification in the aorta. There is no hemodynamically significant obstruction in the abdominal aorta. Celiac: Celiac artery and its branches are widely patent. No aneurysm or dissection involving these vessels. SMA: Superior mesenteric artery and its branches are widely patent. No evident aneurysm or dissection. Renals: There is a single renal artery on each side. Each renal artery and respective branches are widely patent. No aneurysm or dissection. No evident fibromuscular dysplasia. IMA: Inferior mesenteric artery and its branches are patent. No aneurysm or dissection involving these vessels. Inflow: There is slight calcification in the proximal right common iliac artery with only minimal plaque in this area. Other major pelvic arterial vessels are patent. No aneurysm or dissection involving the major pelvic arterial vessels. Proximal superficial femoral and profunda femoral arteries are likewise1 widely patent without aneurysm or dissection involving these vessels. Veins: No obvious venous abnormality within the limitations of this arterial phase study. Review of the MIP images confirms the above findings. NON-VASCULAR Hepatobiliary: No focal liver lesions are appreciable. Gallbladder wall is not appreciably thickened. There is no biliary duct dilatation. Pancreas: No pancreatic mass or  inflammatory focus. Spleen: No splenic lesions are evident. Adrenals/Urinary Tract: Adrenals bilaterally appear normal. There is decreased enhancement in the upper pole of the left kidney as well as to a lesser extent in the upper pole of the right kidney. There is no well-defined mass on either side. No appreciable hydronephrosis on either side. No perinephric stranding or fluid on either side. No renal or ureteral calculus is appreciable on either side. Urinary bladder is midline with wall thickness within normal limits. Stomach/Bowel: There is no appreciable bowel wall or mesenteric thickening. There are occasional sigmoid diverticula without diverticulitis. No evident bowel obstruction. Terminal ileum appears within normal limits. No free air or portal venous air. Lymphatic: There is no evident adenopathy in the abdomen or pelvis. Reproductive: The uterus is anteverted. There is an intrauterine device positioned within the endometrium. No pelvic mass evident. Other: Appendix appears normal. No evident abscess or ascites in the abdomen or pelvis. There is mild fat in the umbilicus. Musculoskeletal: There are multiple small sclerotic areas throughout the bones which may represent multiple bone islands. No lytic or destructive bone lesions. No intramuscular lesions. Review of the MIP images confirms the above findings. IMPRESSION: CT angiogram chest: 1. No thoracic aortic aneurysm or dissection. No appreciable aortic atherosclerosis. Visualized great vessels appear unremarkable. No pericardial effusion. 2.  No pulmonary embolus. 3. Slight bibasilar atelectasis. No edema or airspace opacity in the lungs. 4.  No evident adenopathy. CT angiogram abdomen; CT angiogram pelvis: 1. No aneurysm or dissection involving the aorta, major mesenteric, or major pelvic arterial vessels. Slight aortic and right common iliac artery atherosclerosis noted. Other vessels are widely patent without appreciable atherosclerotic change. 2.  There is decreased enhancement in each upper pole kidney region, more notable on the left than on the right. No surrounding perinephric fluid or well-defined abscess. This appearance does raise concern for potential renal infection with potential for bacterial nephritis/acute lobar nephronia bilaterally. Appropriate laboratory  correlation advised. 3. No bowel obstruction. No abscess in the abdomen or pelvis. Appendix appears normal. Occasional sigmoid diverticula without diverticulitis. 4. There are multiple small sclerotic areas throughout the bones which likely represent multiple bone islands. Osteopoikilosis potentially could present in this manner. The multiplicity of these sclerotic foci is somewhat unusual. It may be prudent to correlate with clinical breast examination and mammographies given multiple sclerotic bony foci in a female. 5. Intrauterine device positioned within the endometrium. Aortic Atherosclerosis (ICD10-I70.0). Electronically Signed   By: Bretta Bang III M.D.   On: 07/19/2019 13:45    Procedures Procedures (including critical care time)  Medications Ordered in ED Medications  nicotine (NICODERM CQ - dosed in mg/24 hours) patch 21 mg (has no administration in time range)  sodium chloride flush (NS) 0.9 % injection 3 mL (3 mLs Intravenous Given 07/19/19 1235)  sodium chloride 0.9 % bolus 1,000 mL (0 mLs Intravenous Stopped 07/19/19 1803)  potassium chloride SA (KLOR-CON) CR tablet 40 mEq (40 mEq Oral Given 07/19/19 1237)  fentaNYL (SUBLIMAZE) injection 50 mcg (50 mcg Intravenous Given 07/19/19 1237)  ondansetron (ZOFRAN) injection 4 mg (4 mg Intravenous Given 07/19/19 1236)  iohexol (OMNIPAQUE) 350 MG/ML injection 100 mL (100 mLs Intravenous Contrast Given 07/19/19 1326)  lactated ringers bolus 1,000 mL (0 mLs Intravenous Stopped 07/19/19 1855)    And  lactated ringers bolus 1,000 mL (0 mLs Intravenous Stopped 07/19/19 1855)  fentaNYL (SUBLIMAZE) injection 50 mcg (50 mcg  Intravenous Given 07/19/19 1549)  cefTRIAXone (ROCEPHIN) 1 g in sodium chloride 0.9 % 100 mL IVPB (0 g Intravenous Stopped 07/19/19 1803)  pantoprazole (PROTONIX) injection 40 mg (40 mg Intravenous Given 07/19/19 1549)  alum & mag hydroxide-simeth (MAALOX/MYLANTA) 200-200-20 MG/5ML suspension 30 mL (30 mLs Oral Given 07/19/19 1549)    And  lidocaine (XYLOCAINE) 2 % viscous mouth solution 15 mL (15 mLs Oral Given 07/19/19 1549)  oxyCODONE-acetaminophen (PERCOCET/ROXICET) 5-325 MG per tablet 1 tablet (1 tablet Oral Given 07/19/19 1809)    ED Course  I have reviewed the triage vital signs and the nursing notes.  Pertinent labs & imaging results that were available during my care of the patient were reviewed by me and considered in my medical decision making (see chart for details).  Clinical Course as of Jul 19 1935  Fri Jul 19, 2019  1155 BP right arm 98/54, left arm 74/62 Repeat right arm 101/55, left 82/72   [EH]  1429 Contaminated, CT scan shows concern for bilateral pyelo.  Will obtain repeat UA and send for culture.   Urinalysis, Routine w reflex microscopic(!) [EH]    Clinical Course User Index [EH] Cristina Gong, PA-C   MDM Rules/Calculators/A&P                     Patient presents today for evaluation of left upper quadrant abdominal pain. On initial evaluation her blood pressures were soft and concern for them being unequal between her 2 arms.  Chest x-ray without evidence of pneumothorax, consolidation, or widened mediastinum.  EKG without ST elevation. Labs are obtained and reviewed, CBC is significant for leukocytosis at 19.0.  CMP with multiple abnormalities including hypokalemia with a potassium of 2.8, her creatinine is slightly elevated at 1.27 and GFR of 52. No recent for comparison.  Potassium was repleted orally, I suspect that this is secondary to poor p.o. intake over the past 2 to 3 days.  Blood cultures were sent.  Lactic acid is not significantly elevated at  1. Troponin x2 is normal. Initial urine was heavily contaminated. Repeat UA with 21-50 white cells, and no bacteria seen.  Urine culture is sent.  CTA dissection study shows no evidence of dissection or PE. She does have multiple osseous lesions which raise concern for possible breast malignancy.  This was discussed with patient along with the need to follow-up as an outpatient.  CTA does show concern for possible bilateral pyelonephritis without renal stones, or other cause for her symptoms found.  Patient was treated in the emergency room with fluids by IV after which her blood pressures normalized.  I suspect most of her earlier hypotension was related to dehydration from poor p.o. intake.  Patient was able to p.o. challenge in the emergency room without difficulty.  She was given IV Rocephin.  Patient and I had shared decision-making discussion.  We discussed risks and benefits of admission versus attempting treatment at home.  I suspect her earlier hypotension is primarily caused by dehydration, however she does have both blood and urine cultures pending.  She was able to p.o. challenge without difficulty.  After discussion of risks and benefits patient elected for trial of outpatient therapy with p.o. antibiotics at home.  We discussed reasons to return to the emergency room and she states her understanding. I suspect that she may have gastric ulcers causing some of the left upper quadrant abdominal pain.  She has already been given medicines for this from urgent care that she will start.  [place] PMP is consulted and she is given a prescription for short course of oxycodone.  She is also given prescription for Keflex and Zofran.  Return precautions were discussed with patient who states their understanding.  At the time of discharge patient denied any unaddressed complaints or concerns.  Patient is agreeable for discharge home.  Note: Portions of this report may have been  transcribed using voice recognition software. Every effort was made to ensure accuracy; however, inadvertent computerized transcription errors may be present  Final Clinical Impression(s) / ED Diagnoses Final diagnoses:  Left upper quadrant abdominal pain  Pyelonephritis  Leukocytosis, unspecified type    Rx / DC Orders ED Discharge Orders         Ordered    oxyCODONE-acetaminophen (PERCOCET/ROXICET) 5-325 MG tablet  Every 6 hours PRN     07/19/19 1905    ondansetron (ZOFRAN ODT) 4 MG disintegrating tablet  Every 8 hours PRN     07/19/19 1905    cephALEXin (KEFLEX) 500 MG capsule  4 times daily     07/19/19 1905           Ollen Gross 07/19/19 1943    Ezequiel Essex, MD 07/19/19 2032

## 2019-07-19 NOTE — Discharge Instructions (Addendum)
You need to follow up with your primary care doctor to get a mammogram.  You have abnormal areas in your bones on your CT scan and you need a diagnostic mammogram to make sure there is not anything abnormal.   Today you received medications that may make you sleepy or impair your ability to make decisions.  For the next 24 hours please do not drive, operate heavy machinery, care for a small child with out another adult present, or perform any activities that may cause harm to you or someone else if you were to fall asleep or be impaired.   You are being prescribed a medication which may make you sleepy. Please follow up of listed precautions for at least 24 hours after taking one dose.  You may have diarrhea from the antibiotics.  It is very important that you continue to take the antibiotics even if you get diarrhea unless a medical professional tells you that you may stop taking them.  If you stop too early the bacteria you are being treated for will become stronger and you may need different, more powerful antibiotics that have more side effects and worsening diarrhea.  Please stay well hydrated and consider probiotics as they may decrease the severity of your diarrhea.  Please be aware that if you take any hormonal contraception (birth control pills, nexplanon, the ring, etc) that your birth control will not work while you are taking antibiotics and you need to use back up protection as directed on the birth control medication information insert.   As we discussed today your potassium was low in the emergency room, most likely due to you not eating and drinking well. Please make sure you are eating high potassium foods over the next few days.

## 2019-07-21 LAB — URINE CULTURE: Culture: >=100000 — AB

## 2019-07-22 ENCOUNTER — Telehealth: Payer: Self-pay | Admitting: *Deleted

## 2019-07-22 NOTE — Telephone Encounter (Signed)
Post ED Visit - Positive Culture Follow-up  Culture report reviewed by antimicrobial stewardship pharmacist: [facility] []  Nathan Batchelder, .D. [x]  [place], Pharm.D., BCPS AQ-ID []  , Pharm.D., BCPS []  Celedonio Miyamoto, Pharm.D., BCPS []  [place], Garvin Fila.D., BCPS, AAHIVP []  , Pharm.D., BCPS, AAHIVP []  Georgina Pillion, PharmD, BCPS []  , PharmD, BCPS []  [place], PharmD, BCPS []  [address], PharmD []  , PharmD, BCPS []  Estella Husk, PharmD  Pharmacy Team []  Lysle Pearl, PharmD []  , PharmD []  Phillips Climes, PharmD []  , Rph []  Agapito Games) , PharmD []  Verlan Friends, PharmD []  , PharmD []  Mervyn Gay, PharmD []  , PharmD []  Vinnie Level, PharmD []  [facility], PharmD []  , PharmD []  Len Childs, PharmD   Positive urine culture Treated with Cephalexin, organism sensitive to the same and no further patient follow-up is required at this time.  [facility] 07/22/2019, 9:51 AM

## 2019-07-24 LAB — CULTURE, BLOOD (ROUTINE X 2)
Culture: NO GROWTH
Culture: NO GROWTH
Special Requests: ADEQUATE
Special Requests: ADEQUATE

## 2020-02-06 IMAGING — US ULTRASOUND RIGHT BREAST LIMITED
1 series · 5 of 5 positions shown · non-contrast
Comparison: Previous exam(s).

CLINICAL DATA: Follow-up probably benign mass in the 12:30 o'clock
position of the right breast and probably benign asymmetry in medial
right breast with no sonographic correlate.

EXAM:
DIGITAL DIAGNOSTIC BILATERAL MAMMOGRAM WITH CAD AND TOMO
ULTRASOUND RIGHT BREAST

[Series 1: ultrasound right breast limited · 0.05mm/px · 5 of 5 slices shown]
[im 1/5]
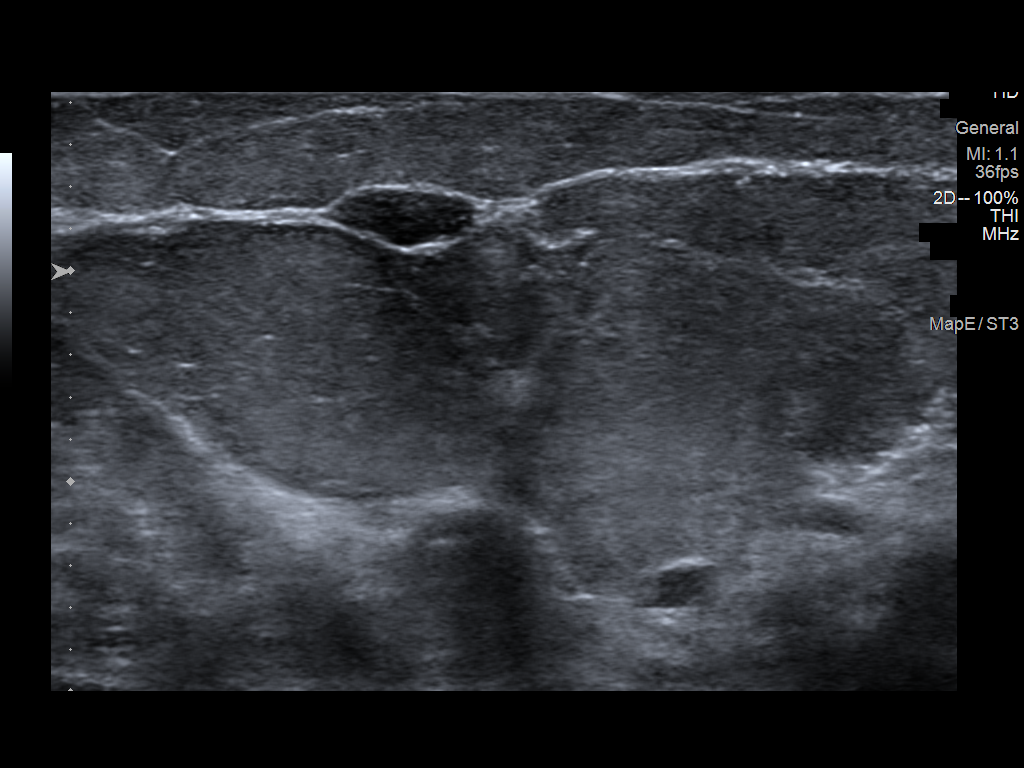
[im 2/5]
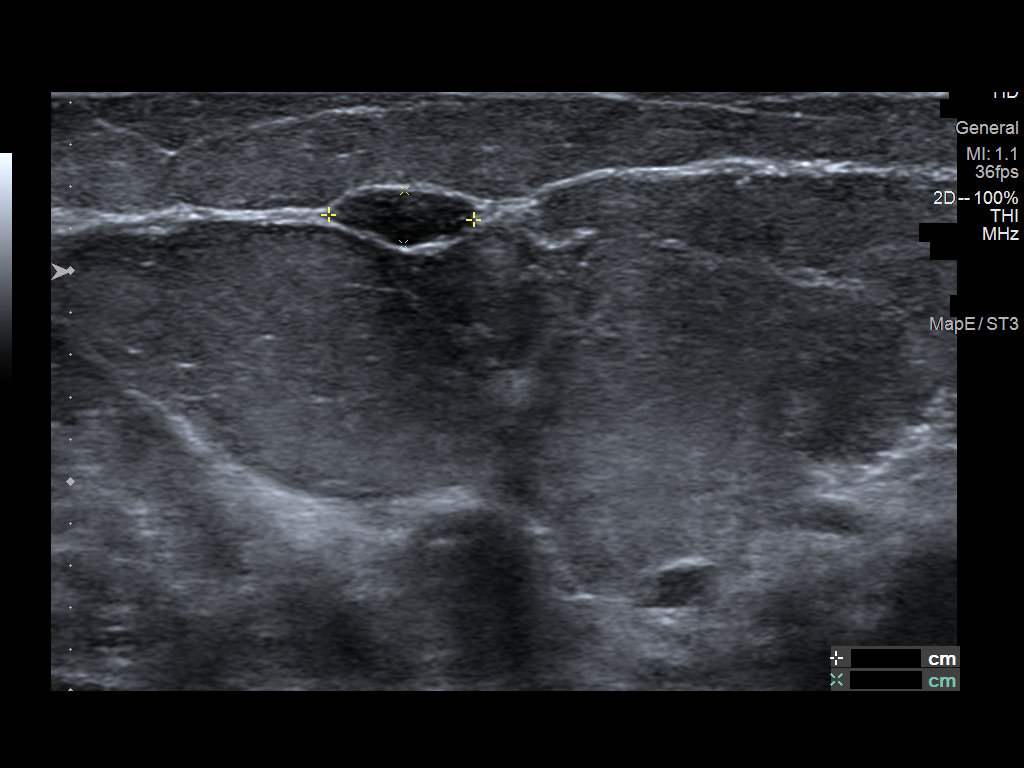
[im 3/5]
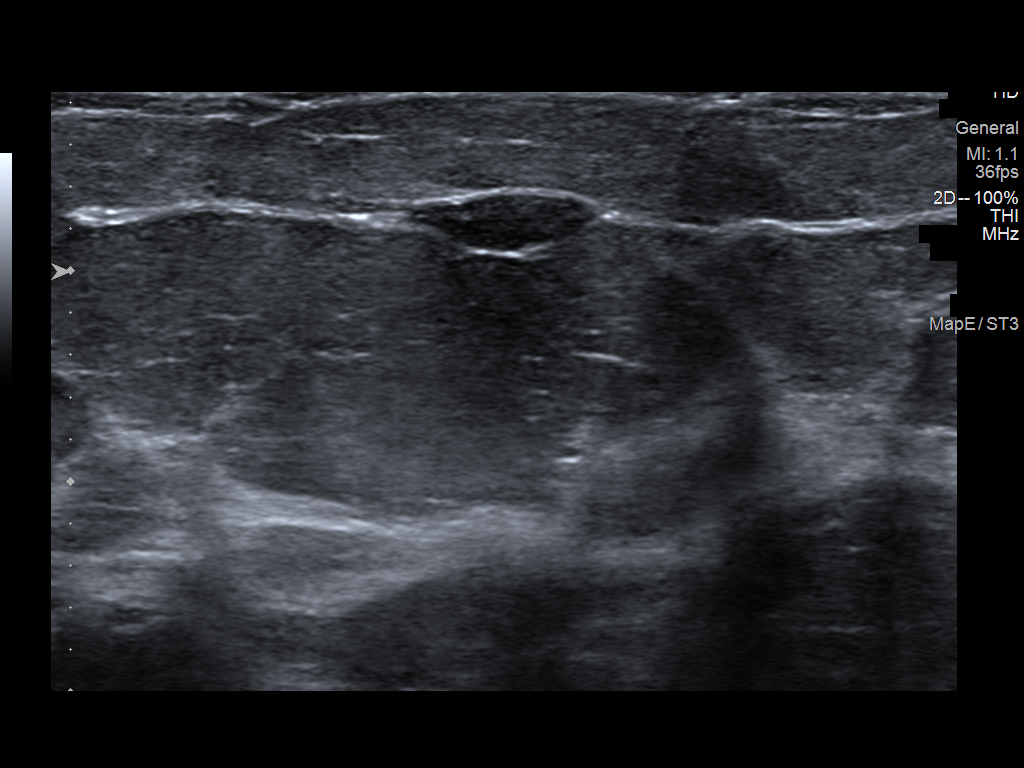
[im 4/5]
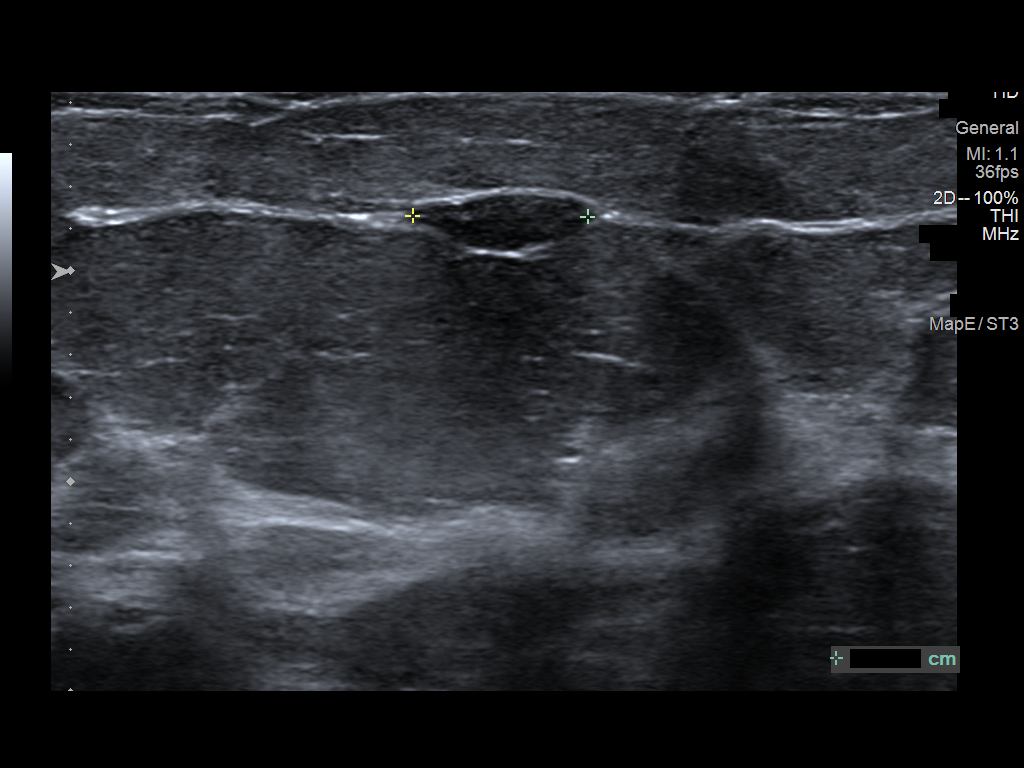
[im 5/5]
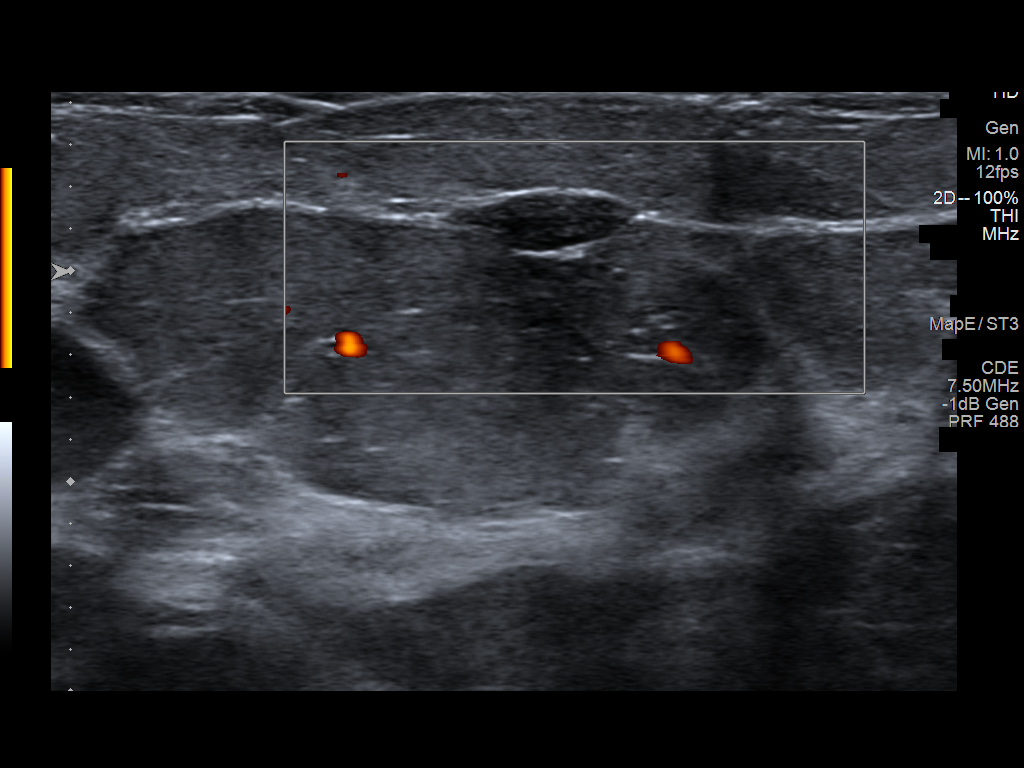

[5 of 5 positions shown; findings below may reference images not displayed]

ACR Breast Density Category b: There are scattered areas of
fibroglandular density.
FINDINGS: The previously demonstrated small asymmetry in the posteromedial
right breast is less prominent than initially seen on 05/09/2017.
The previously demonstrated small oval circumscribed mass in the
12:30 o'clock position of the right breast is unchanged. No interval
findings elsewhere in either breast suspicious for malignancy.

Mammographic images were processed with CAD.

Targeted ultrasound is performed, showing an 8 x 7 x 3 mm oval,
horizontally oriented, circumscribed, hypoechoic mass in the 12:30
o'clock position of the right breast, 5 cm from the nipple. This
measured 8 x 7 x 3 mm on 12/12/2017 and 7 x 7 x 3 mm on 05/30/2017.
IMPRESSION: 1. Stable right breast probable fibroadenoma.
2. Left prominent probable small island of fibroglandular tissue in
the posteromedial right breast.
3. No evidence of malignancy in either breast.

RECOMMENDATION:
Bilateral diagnostic mammogram and right breast ultrasound in 1
year. That will complete 2 years follow-up of the right breast
probable fibroadenoma and probably benign asymmetry.

I have discussed the findings and recommendations with the patient.
Results were also provided in writing at the conclusion of the
visit. If applicable, a reminder letter will be sent to the patient
regarding the next appointment.

BI-RADS CATEGORY  3: Probably benign.

## 2020-02-06 IMAGING — MG DIGITAL DIAGNOSTIC BILATERAL MAMMOGRAM WITH TOMO AND CAD
8 series · 8 of 24 positions shown · non-contrast
Comparison: Previous exam(s).

CLINICAL DATA: Follow-up probably benign mass in the 12:30 o'clock
position of the right breast and probably benign asymmetry in medial
right breast with no sonographic correlate.

EXAM:
DIGITAL DIAGNOSTIC BILATERAL MAMMOGRAM WITH CAD AND TOMO
ULTRASOUND RIGHT BREAST

[L CC synth-2D]
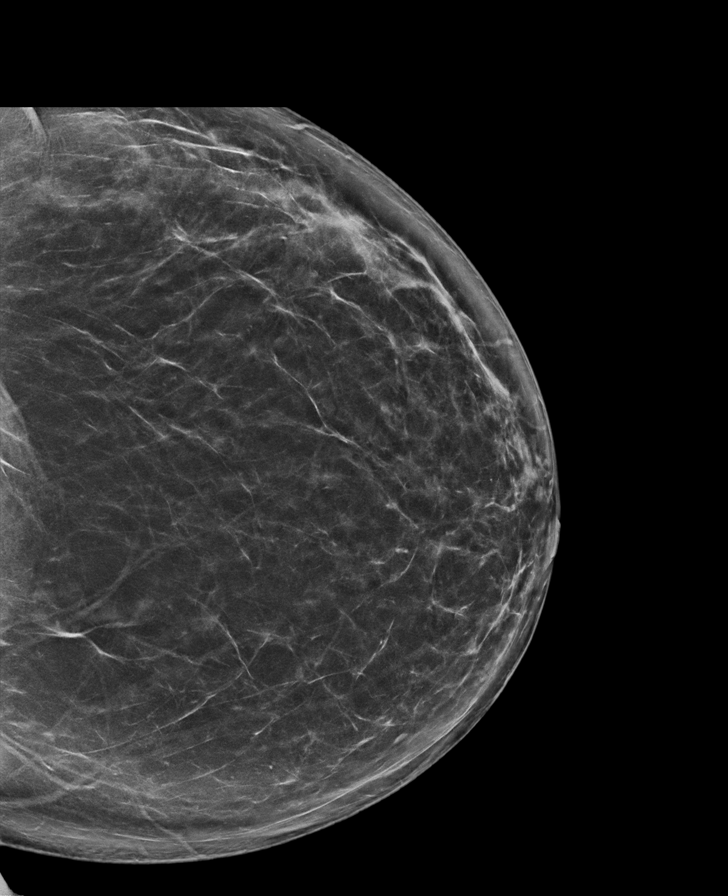

[R CC synth-2D]
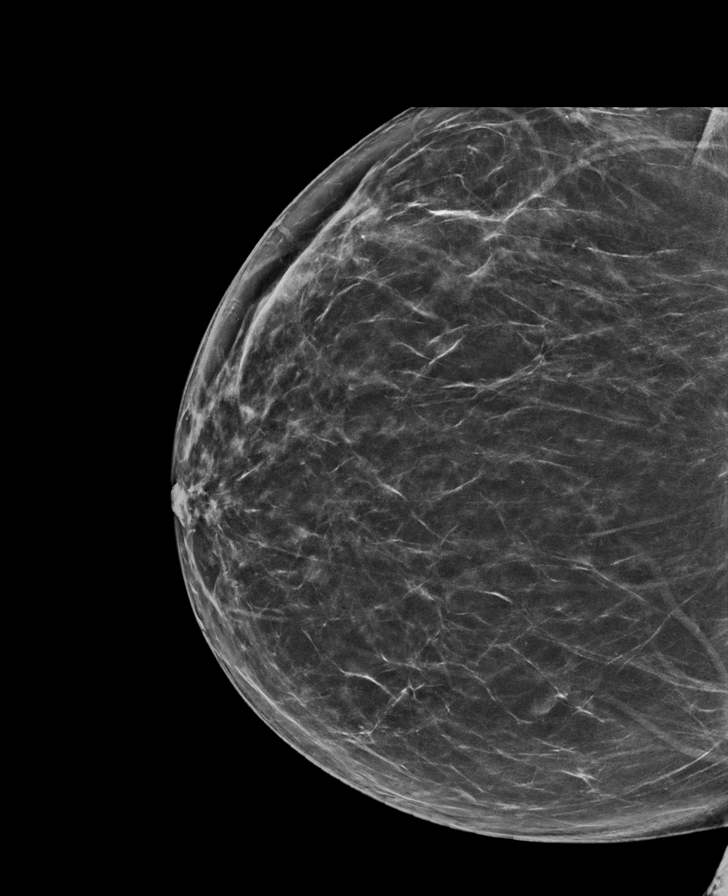

[R MLO synth-2D]
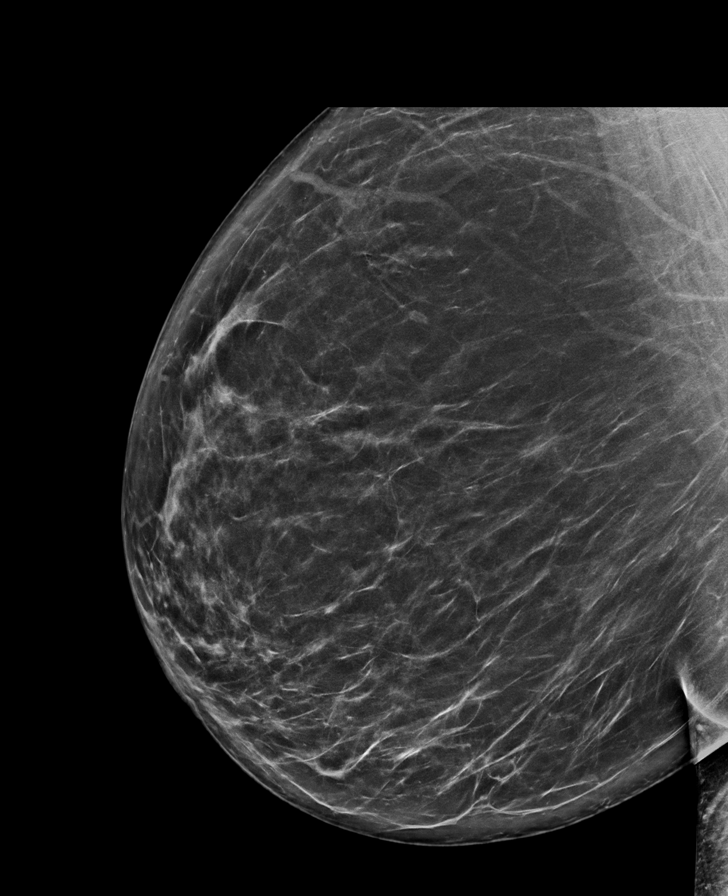

[L MLO synth-2D]
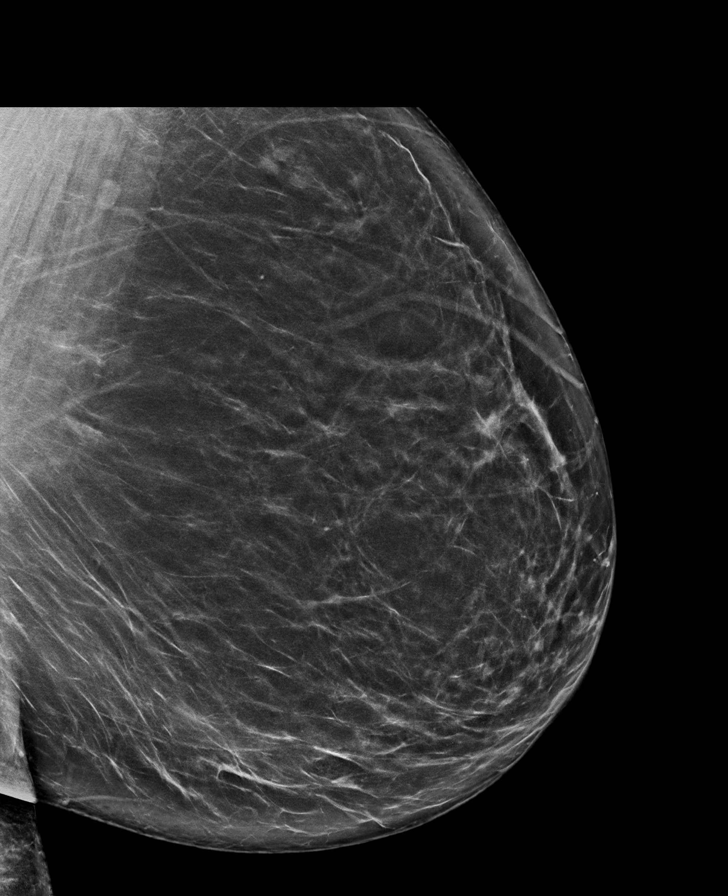

[L MLO tomo · tomo slice 49/97.0]
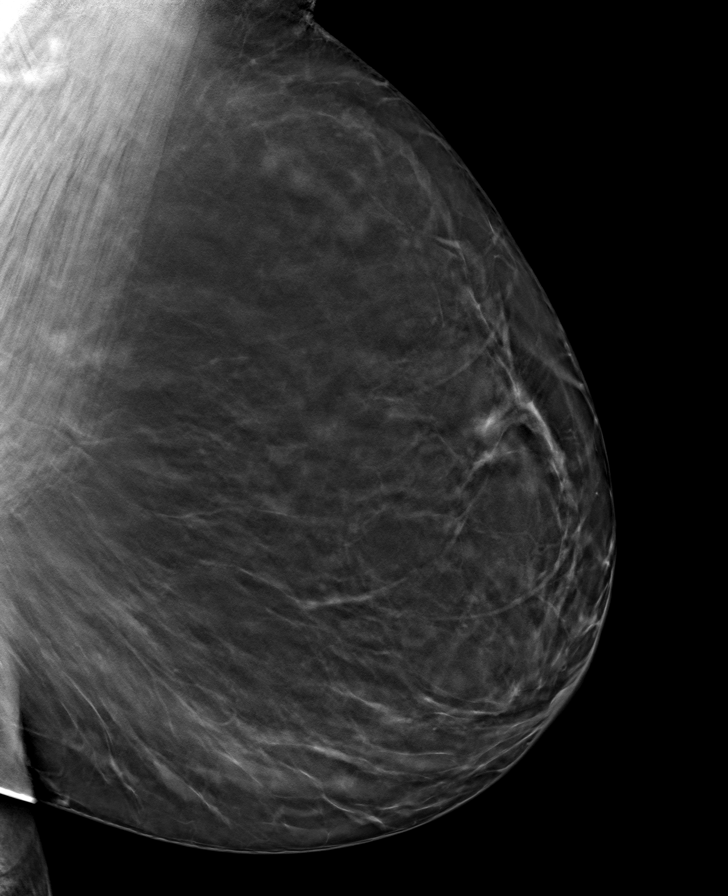

[R CC tomo · tomo slice 45/88.0]
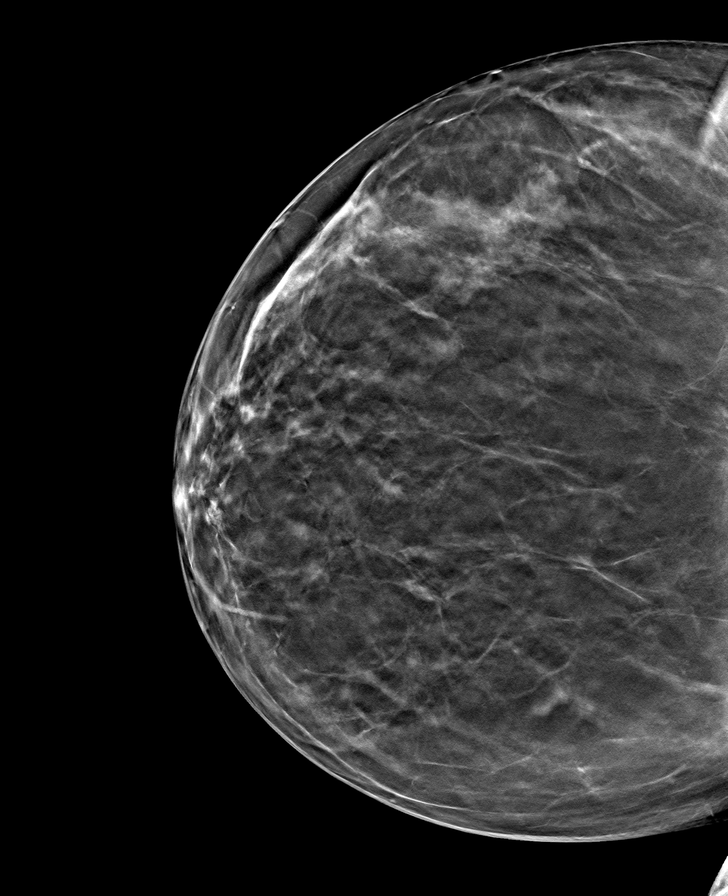

[L CC tomo · tomo slice 49/96.0]
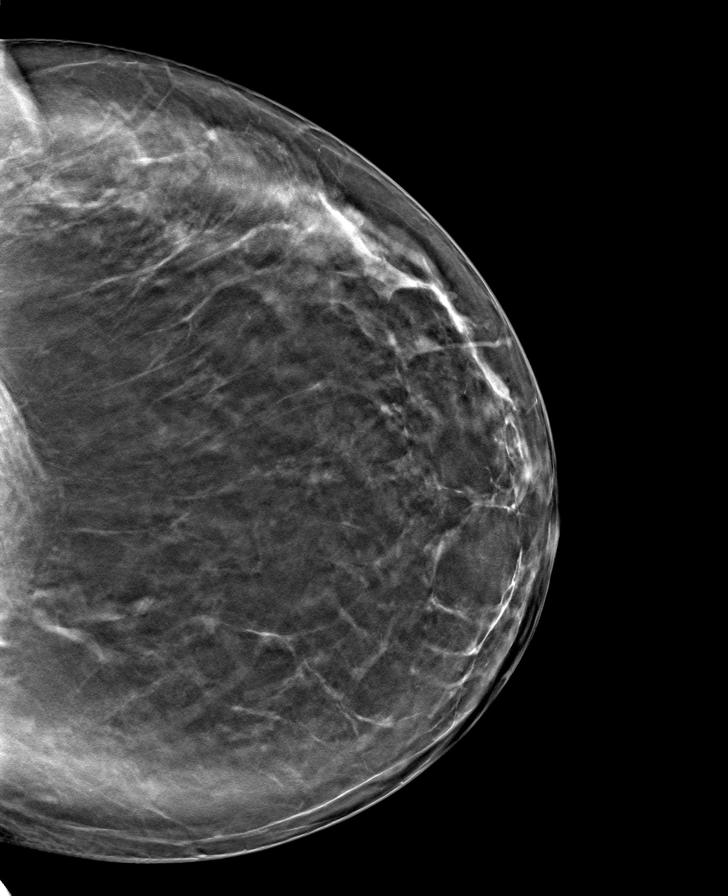

[R MLO tomo · tomo slice 49/97.0]
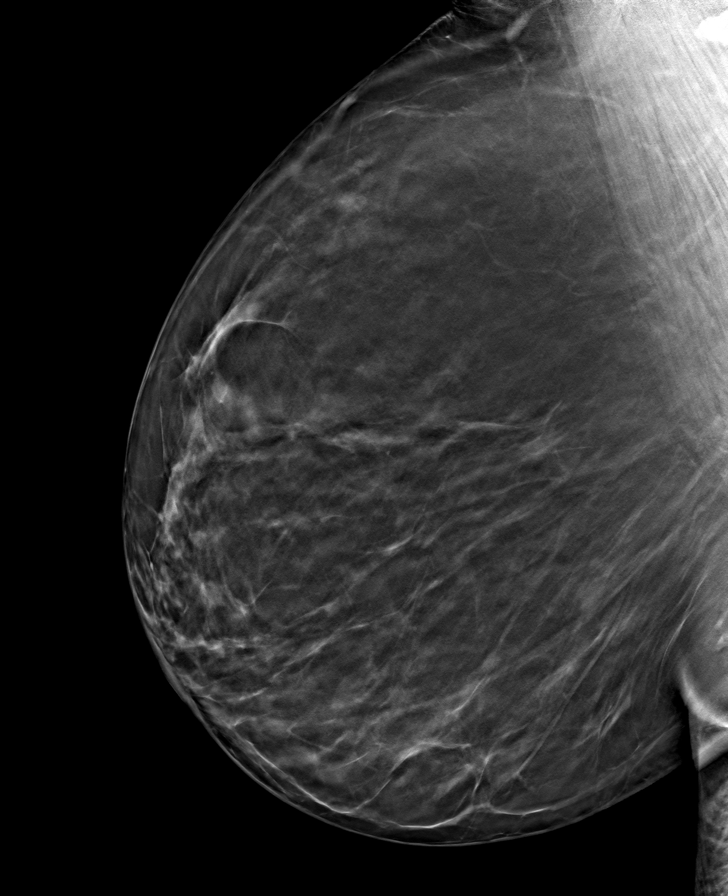

[8 of 24 positions shown; findings below may reference images not displayed]

ACR Breast Density Category b: There are scattered areas of
fibroglandular density.
FINDINGS: The previously demonstrated small asymmetry in the posteromedial
right breast is less prominent than initially seen on 05/09/2017.
The previously demonstrated small oval circumscribed mass in the
12:30 o'clock position of the right breast is unchanged. No interval
findings elsewhere in either breast suspicious for malignancy.

Mammographic images were processed with CAD.

Targeted ultrasound is performed, showing an 8 x 7 x 3 mm oval,
horizontally oriented, circumscribed, hypoechoic mass in the 12:30
o'clock position of the right breast, 5 cm from the nipple. This
measured 8 x 7 x 3 mm on 12/12/2017 and 7 x 7 x 3 mm on 05/30/2017.
IMPRESSION: 1. Stable right breast probable fibroadenoma.
2. Left prominent probable small island of fibroglandular tissue in
the posteromedial right breast.
3. No evidence of malignancy in either breast.

RECOMMENDATION:
Bilateral diagnostic mammogram and right breast ultrasound in 1
year. That will complete 2 years follow-up of the right breast
probable fibroadenoma and probably benign asymmetry.

I have discussed the findings and recommendations with the patient.
Results were also provided in writing at the conclusion of the
visit. If applicable, a reminder letter will be sent to the patient
regarding the next appointment.

BI-RADS CATEGORY  3: Probably benign.

## 2022-11-07 ENCOUNTER — Ambulatory Visit: Payer: 59 | Admitting: Podiatry

## 2023-01-04 ENCOUNTER — Encounter: Payer: Self-pay | Admitting: Podiatry

## 2023-01-04 ENCOUNTER — Ambulatory Visit (INDEPENDENT_AMBULATORY_CARE_PROVIDER_SITE_OTHER): Payer: No Typology Code available for payment source | Admitting: Podiatry

## 2023-01-04 DIAGNOSIS — M722 Plantar fascial fibromatosis: Secondary | ICD-10-CM

## 2023-01-04 MED ORDER — MELOXICAM 15 MG PO TABS: 15.0000 mg | ORAL_TABLET | Freq: Every day | ORAL | 1 refills | Status: AC

## 2023-01-04 MED ORDER — METHYLPREDNISOLONE 4 MG PO TBPK: ORAL_TABLET | ORAL | 0 refills | Status: AC

## 2023-01-04 MED ORDER — BETAMETHASONE SOD PHOS & ACET 6 (3-3) MG/ML IJ SUSP
3.0000 mg | Freq: Once | INTRAMUSCULAR | Status: AC
  Administered 2023-01-04: 3 mg via INTRA_ARTICULAR

## 2023-01-04 NOTE — Progress Notes (Signed)
   Chief Complaint  Patient presents with   Foot Pain    Patient is here for right foot pain sharp in first 15 steps of walking, heel pain, hard to walk, hurts more when not using   Plantar Fasciitis    Right heel pain, hard to walk, hurts more when not using     Subjective: 47 y.o. female presenting today as a reestablish new patient for evaluation of right heel pain ongoing for about 1 year now.  She has a history of plantar fasciitis.  She eventually had endoscopic plantar fasciotomy to the left lower extremity in 2020.  She says that her left heel no longer has any pain or tenderness and she is doing great.  She has not done anything recently for treatment for the right heel   Past Medical History:  Diagnosis Date   Esophageal reflux    Graves disease    Hypothyroid    Migraine headache    Varicose veins of lower extremity    Past Surgical History:  Procedure Laterality Date   CERVICAL BIOPSY  W/ LOOP ELECTRODE EXCISION     No Known Allergies   Objective: Physical Exam General: The patient is alert and oriented x3 in no acute distress.  Dermatology: Skin is warm, dry and supple bilateral lower extremities. Negative for open lesions or macerations bilateral.   Vascular: Dorsalis Pedis and Posterior Tibial pulses palpable bilateral.  Capillary fill time is immediate to all digits.  Neurological: Grossly intact via light touch  Musculoskeletal: Tenderness to palpation to the plantar aspect of the right heel along the plantar fascia. All other joints range of motion within normal limits bilateral. Strength 5/5 in all groups bilateral.    Assessment: 1. Plantar fasciitis right 2. H/o EPF LT. DOS: 07/30/2018  Plan of Care:  1. Patient evaluated. Xrays reviewed.   2. Injection of 0.5cc Celestone soluspan injected into the right plantar fascia  3. Rx for Medrol Dose Pack placed 4. Rx for Meloxicam ordered for patient. 5.  Continue wearing good supportive shoes and sandals  with arch support 6. Instructed patient regarding therapies and modalities at home to alleviate symptoms.  7. Return to clinic in 4 weeks.    *Working at [employer]   Felecia Shelling, DPM [facility]  Dr. Felecia Shelling, DPM    [address] [place], [place] [address]                Office [phone]  Fax [phone]

## 2023-02-01 ENCOUNTER — Ambulatory Visit: Payer: No Typology Code available for payment source | Admitting: Podiatry
